# Patient Record
Sex: Female | Born: 1935 | Race: White | Hispanic: No | Marital: Single | Smoking: Never smoker
Health system: Southern US, Community
[De-identification: ages and names within clinical notes are randomized; demographics above are authoritative.]

## PROBLEM LIST (undated history)

## (undated) DIAGNOSIS — R51 Headache: Secondary | ICD-10-CM

## (undated) DIAGNOSIS — I2510 Atherosclerotic heart disease of native coronary artery without angina pectoris: Secondary | ICD-10-CM

## (undated) DIAGNOSIS — N289 Disorder of kidney and ureter, unspecified: Secondary | ICD-10-CM

## (undated) DIAGNOSIS — K5790 Diverticulosis of intestine, part unspecified, without perforation or abscess without bleeding: Secondary | ICD-10-CM

## (undated) DIAGNOSIS — M1990 Unspecified osteoarthritis, unspecified site: Secondary | ICD-10-CM

## (undated) DIAGNOSIS — R519 Headache, unspecified: Secondary | ICD-10-CM

## (undated) DIAGNOSIS — I509 Heart failure, unspecified: Secondary | ICD-10-CM

## (undated) HISTORY — PX: ROTATOR CUFF REPAIR: SHX139

## (undated) HISTORY — PX: ABDOMINAL HYSTERECTOMY: SHX81

## (undated) HISTORY — PX: APPENDECTOMY: SHX54

## (undated) HISTORY — PX: JOINT REPLACEMENT: SHX530

## (undated) HISTORY — PX: KNEE ARTHROSCOPY: SUR90

---

## 1998-09-15 ENCOUNTER — Encounter: Payer: Self-pay | Admitting: Specialist

## 1998-09-15 ENCOUNTER — Ambulatory Visit (HOSPITAL_COMMUNITY): Admission: RE | Admit: 1998-09-15 | Discharge: 1998-09-15 | Payer: Self-pay | Admitting: Specialist

## 1998-10-20 ENCOUNTER — Ambulatory Visit (HOSPITAL_COMMUNITY): Admission: RE | Admit: 1998-10-20 | Discharge: 1998-10-20 | Payer: Self-pay | Admitting: Specialist

## 1998-10-20 ENCOUNTER — Encounter: Payer: Self-pay | Admitting: Specialist

## 1998-11-03 ENCOUNTER — Ambulatory Visit (HOSPITAL_COMMUNITY): Admission: RE | Admit: 1998-11-03 | Discharge: 1998-11-03 | Payer: Self-pay | Admitting: Specialist

## 1998-11-03 ENCOUNTER — Encounter: Payer: Self-pay | Admitting: Specialist

## 1998-11-17 ENCOUNTER — Encounter: Payer: Self-pay | Admitting: Specialist

## 1998-11-17 ENCOUNTER — Ambulatory Visit (HOSPITAL_COMMUNITY): Admission: RE | Admit: 1998-11-17 | Discharge: 1998-11-17 | Payer: Self-pay | Admitting: Specialist

## 2000-03-27 ENCOUNTER — Encounter: Payer: Self-pay | Admitting: Emergency Medicine

## 2000-03-27 ENCOUNTER — Inpatient Hospital Stay (HOSPITAL_COMMUNITY): Admission: EM | Admit: 2000-03-27 | Discharge: 2000-03-30 | Payer: Self-pay

## 2000-04-03 ENCOUNTER — Inpatient Hospital Stay (HOSPITAL_COMMUNITY): Admission: RE | Admit: 2000-04-03 | Discharge: 2000-04-07 | Payer: Self-pay | Admitting: Specialist

## 2004-05-11 ENCOUNTER — Inpatient Hospital Stay (HOSPITAL_COMMUNITY): Admission: EM | Admit: 2004-05-11 | Discharge: 2004-05-16 | Payer: Self-pay | Admitting: Emergency Medicine

## 2004-05-16 ENCOUNTER — Ambulatory Visit: Payer: Self-pay | Admitting: Physical Medicine & Rehabilitation

## 2004-05-16 ENCOUNTER — Inpatient Hospital Stay
Admission: RE | Admit: 2004-05-16 | Discharge: 2004-05-22 | Payer: Self-pay | Admitting: Physical Medicine & Rehabilitation

## 2004-05-18 ENCOUNTER — Ambulatory Visit: Payer: Self-pay | Admitting: Physical Medicine & Rehabilitation

## 2004-10-10 ENCOUNTER — Observation Stay (HOSPITAL_COMMUNITY): Admission: RE | Admit: 2004-10-10 | Discharge: 2004-10-11 | Payer: Self-pay | Admitting: Orthopedic Surgery

## 2006-01-01 ENCOUNTER — Ambulatory Visit: Admission: RE | Admit: 2006-01-01 | Discharge: 2006-01-01 | Payer: Self-pay | Admitting: Cardiology

## 2006-01-01 ENCOUNTER — Encounter (INDEPENDENT_AMBULATORY_CARE_PROVIDER_SITE_OTHER): Payer: Self-pay | Admitting: Cardiology

## 2006-01-16 ENCOUNTER — Inpatient Hospital Stay (HOSPITAL_COMMUNITY): Admission: RE | Admit: 2006-01-16 | Discharge: 2006-01-22 | Payer: Self-pay | Admitting: Surgery

## 2006-01-16 ENCOUNTER — Encounter (INDEPENDENT_AMBULATORY_CARE_PROVIDER_SITE_OTHER): Payer: Self-pay | Admitting: *Deleted

## 2010-04-14 ENCOUNTER — Inpatient Hospital Stay (HOSPITAL_COMMUNITY): Admission: EM | Admit: 2010-04-14 | Discharge: 2010-04-20 | Payer: Self-pay | Admitting: Internal Medicine

## 2010-04-16 ENCOUNTER — Encounter (INDEPENDENT_AMBULATORY_CARE_PROVIDER_SITE_OTHER): Payer: Self-pay | Admitting: Internal Medicine

## 2010-04-17 ENCOUNTER — Encounter (INDEPENDENT_AMBULATORY_CARE_PROVIDER_SITE_OTHER): Payer: Self-pay | Admitting: Internal Medicine

## 2010-07-08 ENCOUNTER — Encounter: Payer: Self-pay | Admitting: Surgery

## 2010-08-28 LAB — COMPREHENSIVE METABOLIC PANEL
ALT: 30 U/L (ref 0–35)
ALT: 398 U/L — ABNORMAL HIGH (ref 0–35)
AST: 430 U/L — ABNORMAL HIGH (ref 0–37)
AST: 498 U/L — ABNORMAL HIGH (ref 0–37)
Albumin: 2.7 g/dL — ABNORMAL LOW (ref 3.5–5.2)
Albumin: 2.8 g/dL — ABNORMAL LOW (ref 3.5–5.2)
Albumin: 2.9 g/dL — ABNORMAL LOW (ref 3.5–5.2)
Alkaline Phosphatase: 200 U/L — ABNORMAL HIGH (ref 39–117)
Alkaline Phosphatase: 216 U/L — ABNORMAL HIGH (ref 39–117)
BUN: 17 mg/dL (ref 6–23)
CO2: 24 mEq/L (ref 19–32)
CO2: 28 mEq/L (ref 19–32)
Calcium: 8.4 mg/dL (ref 8.4–10.5)
Calcium: 8.7 mg/dL (ref 8.4–10.5)
Chloride: 109 mEq/L (ref 96–112)
Creatinine, Ser: 1.72 mg/dL — ABNORMAL HIGH (ref 0.4–1.2)
GFR calc Af Amer: 34 mL/min — ABNORMAL LOW (ref >60)
GFR calc Af Amer: 40 mL/min — ABNORMAL LOW (ref >60)
GFR calc non Af Amer: 29 mL/min — ABNORMAL LOW (ref >60)
GFR calc non Af Amer: 29 mL/min — ABNORMAL LOW (ref >60)
Glucose, Bld: 121 mg/dL — ABNORMAL HIGH (ref 70–99)
Glucose, Bld: 71 mg/dL (ref 70–99)
Potassium: 3.3 mEq/L — ABNORMAL LOW (ref 3.5–5.1)
Potassium: 4.1 mEq/L (ref 3.5–5.1)
Potassium: 4.4 mEq/L (ref 3.5–5.1)
Sodium: 142 mEq/L (ref 135–145)
Sodium: 142 mEq/L (ref 135–145)
Total Bilirubin: 4.4 mg/dL — ABNORMAL HIGH (ref 0.3–1.2)
Total Protein: 5.1 g/dL — ABNORMAL LOW (ref 6.0–8.3)
Total Protein: 5.2 g/dL — ABNORMAL LOW (ref 6.0–8.3)

## 2010-08-28 LAB — TYPE AND SCREEN

## 2010-08-28 LAB — CBC
HCT: 26.2 % — ABNORMAL LOW (ref 36.0–46.0)
Hemoglobin: 8.9 g/dL — ABNORMAL LOW (ref 12.0–15.0)
MCH: 31.4 pg (ref 26.0–34.0)
MCHC: 33.7 g/dL (ref 30.0–36.0)
Platelets: 115 10*3/uL — ABNORMAL LOW (ref 150–400)
RBC: 2.71 MIL/uL — ABNORMAL LOW (ref 3.87–5.11)
RBC: 2.86 MIL/uL — ABNORMAL LOW (ref 3.87–5.11)
WBC: 4.1 10*3/uL (ref 4.0–10.5)

## 2010-08-28 LAB — LIPASE, BLOOD: Lipase: 23 U/L (ref 11–59)

## 2010-08-28 LAB — GLUCOSE, CAPILLARY: Glucose-Capillary: 122 mg/dL — ABNORMAL HIGH (ref 70–99)

## 2010-08-29 LAB — URINE MICROSCOPIC-ADD ON

## 2010-08-29 LAB — CBC
Hemoglobin: 9.5 g/dL — ABNORMAL LOW (ref 12.0–15.0)
MCH: 30.8 pg (ref 26.0–34.0)
MCHC: 32.5 g/dL (ref 30.0–36.0)
MCV: 94.6 fL (ref 78.0–100.0)
Platelets: 127 10*3/uL — ABNORMAL LOW (ref 150–400)
RBC: 3.06 MIL/uL — ABNORMAL LOW (ref 3.87–5.11)
RDW: 12.7 % (ref 11.5–15.5)
WBC: 4.4 10*3/uL (ref 4.0–10.5)
WBC: 4.9 10*3/uL (ref 4.0–10.5)

## 2010-08-29 LAB — COMPREHENSIVE METABOLIC PANEL
AST: 37 U/L (ref 0–37)
Albumin: 2.9 g/dL — ABNORMAL LOW (ref 3.5–5.2)
Albumin: 2.9 g/dL — ABNORMAL LOW (ref 3.5–5.2)
Alkaline Phosphatase: 74 U/L (ref 39–117)
BUN: 24 mg/dL — ABNORMAL HIGH (ref 6–23)
Calcium: 8.6 mg/dL (ref 8.4–10.5)
Chloride: 115 mEq/L — ABNORMAL HIGH (ref 96–112)
Creatinine, Ser: 1.81 mg/dL — ABNORMAL HIGH (ref 0.4–1.2)
GFR calc Af Amer: 38 mL/min — ABNORMAL LOW (ref >60)
Potassium: 4.3 mEq/L (ref 3.5–5.1)
Total Bilirubin: 1.1 mg/dL (ref 0.3–1.2)
Total Protein: 4.8 g/dL — ABNORMAL LOW (ref 6.0–8.3)
Total Protein: 5 g/dL — ABNORMAL LOW (ref 6.0–8.3)

## 2010-08-29 LAB — PROTIME-INR
INR: 1.26 (ref 0.00–1.49)
Prothrombin Time: 16 seconds — ABNORMAL HIGH (ref 11.6–15.2)

## 2010-08-29 LAB — URINALYSIS, ROUTINE W REFLEX MICROSCOPIC
Bilirubin Urine: NEGATIVE
Hgb urine dipstick: NEGATIVE
Specific Gravity, Urine: 1.012 (ref 1.005–1.030)
Urobilinogen, UA: 1 mg/dL (ref 0.0–1.0)

## 2010-08-29 LAB — PHOSPHORUS: Phosphorus: 3.5 mg/dL (ref 2.3–4.6)

## 2010-08-29 LAB — MAGNESIUM: Magnesium: 2.3 mg/dL (ref 1.5–2.5)

## 2010-08-29 LAB — DIFFERENTIAL
Eosinophils Relative: 3 % (ref 0–5)
Lymphocytes Relative: 25 % (ref 12–46)
Lymphs Abs: 1.1 10*3/uL (ref 0.7–4.0)
Neutrophils Relative %: 65 % (ref 43–77)

## 2010-08-29 LAB — LIPID PANEL
Total CHOL/HDL Ratio: 3.7 RATIO
VLDL: 16 mg/dL (ref 0–40)

## 2010-08-29 LAB — LIPASE, BLOOD
Lipase: 33 U/L (ref 11–59)
Lipase: 510 U/L — ABNORMAL HIGH (ref 11–59)

## 2010-08-29 LAB — CREATININE, URINE, RANDOM: Creatinine, Urine: 63.2 mg/dL

## 2010-08-29 LAB — SODIUM, URINE, RANDOM: Sodium, Ur: 162 mEq/L

## 2010-11-02 NOTE — Discharge Summary (Signed)
[name], [name]                ACCOUNT NO.:  [id number]   MEDICAL RECORD NO.:  [id number]           PATIENT TYPE:   LOCATION:                                 FACILITY:   PHYSICIAN:  Evelene Croon, M.D.     DATE OF BIRTH:  1917-07-16   DATE OF ADMISSION:  DATE OF DISCHARGE:                                 DISCHARGE SUMMARY   PRIMARY DIAGNOSES:  1. Severe single vessel coronary artery disease.  2. Severe mitral regurgitation with pulmonary edema and congestive heart      failure.   IN HOSPITAL DIAGNOSES:  1. Acute blood loss anemia postoperatively.  2. Volume overload postoperatively.   SECONDARY DIAGNOSES:  1. Hypertension.  2. History of goiter.  3. History of spinal stenosis with pain in her back and legs.  4. Status post left total hip replacement 1994.  5. Status post right hip fracture requiring open reduction internal      fixation with pins.  6. Status post left rotator cuff surgery.  7. Status post hysterectomy.  8. Status post appendectomy.  9. Status post right knee arthroscopy.   ALLERGIES:  Allergic to codeine and erythromycin.   IN HOSPITAL OPERATIONS AND PROCEDURES:  1. Coronary artery bypass grafting x2 using a left internal mammary artery      graft to left anterior descending coronary, saphenous vein graft to      diagonal branch of the left anterior descending.  2. Mitral valve repair with quadrangular resection of flow segment      posterior mitral leaflet with implantation of 28 mm Seguin annuloplasty      ring.  3. Endoscopic vein harvesting from left leg.   HISTORY AND HOSPITAL COURSE:  The patient is an 77-year female who has been  in fairly good health until the end of June when she developed a fairly  sudden onset of shortness of breath and dyspnea as well as lower extremity  edema. She was diagnosed with pulmonary edema and has been hospitalized for  this. She had repeated emergency room visits over a three week period. Her  marked dyspnea  responding to diuresis. Echocardiogram was done showing a  normal ejection fraction with mitral valve prolapse. The patient had been  followed for mitral valve prolapse for several years. She subsequently  underwent cardiac catheterization by Dr. Aleen Campi on December 17, 2005 which  showed significant single-vessel coronary artery disease with a 90% stenosis  of the large first diagonal branch and 60% LAD stenosis just distal to the  diagonal branch. Left ventricular ejection fraction was about 55% with  severe mitral regurgitation, marked left atrial enlargement. There is mild  pulmonary hypertension with PA pressure of 54/14, wedge pressure 21, V wave  42. There is no gradient across the aortic valve.  Cardiac index was 2.78.  The patient then underwent repeat echocardiogram at [facility]  which showed severe mitral regurgitation which was felt to be flow segments  of both the anterior and posterior mitral valve leaflets. Aortic valve had  no significant abnormality. There is severe mitral regurgitation. Ejection  fraction was 60%. The patient was referred to Dr. Laneta Simmers for evaluation.  Dr. Laneta Simmers discussed with the patient undergoing coronary artery bypass  grafting as well as repair of mitral valve. He discussed risks and benefits.  The patient acknowledged understanding and agreed to proceed. Surgery was  scheduled for January 16, 2006. Please see dictated H&P for further History  and Physical exam.   The patient was taken to the operating room on January 16, 2006 where she  underwent coronary artery bypass grafting x2 using a left internal mammary  artery graft to left anterior descending coronary with a saphenous vein  graft to diagonal branch with left anterior descending. The patient also had  done mitral valve repair with quadrangular resection of large segment  posterior mitral leaflet with implantation of 28 mm Seguin annuloplasty  ring. Endoscopic vein harvesting was done to  the left leg. The patient  tolerated this procedure well and was transferred over to the intensive care  unit in stable condition. Immediately following surgery the patient was seen  to be hemodynamically stable. She was extubated the evening of surgery.  Following extubation the patient was seen to be alert and oriented x3.  Neurological intact. The patient's postop course was complicated by acute  blood loss anemia. Her hemoglobin/hematocrit dropped to 8.8 and 25.8 on  postop day #2. __________  the patient seemed to be nauseated. It was held  at that point. Hemoglobin/hematocrit were followed. They remained stable  over the next several days. Iron was restarted postop day #4. Last  hemoglobin and hematocrit was seen postop day #4 8.7 and 25.4. Continued to  be monitored during the remainder of her postoperative course to make sure  that blood loss anemia is resolving. The patient also developed volume  overload postoperatively. She was started on diuretics at that time. She was  still several pounds above her preoperative weight limit prior to discharge  home. She was at 69.8 kg postop day 4. Her preoperative weight limit was 63  kg. Plan will be to continue the patient on diuretics.   Remainder of the patient's postoperative course was pretty much  unremarkable. Chest tubes and lines are DC'd in a normal fashion.  Postoperative chest x-ray was stable. The patient's vital signs are  monitored and seemed to be stable. She was started back on her beta blocker.  Systolic blood pressure remained stable. We will hold ACE inhibitor until  further day and will restart if blood pressure allows. The patient was able  to be weaned off oxygen sating greater than 90% on room air. The patient was  out of bed ambulating with assistance. She did use rolling walker. The  patient's incisions were clean, dry, and healing well. She remained in normal sinus rhythm postoperatively. Due to the patient's  mitral valve  repair it was felt to hold off on starting Coumadin due to the patient's  risks of bleeding due to her being 77 years old. Instead of Coumadin plan  was to start the patient on Plavix as well as aspirin. Continue Plavix for 3  months. The patient was noted to have a low grade temperature 100  postoperatively. This was monitored over next several days and came down so  patient was afebrile. The patient was tolerating regular diet well. No  nausea, vomiting noted.   The patient is tentatively ready for discharge home in next 1-2 days. Follow-  up appointment:  Follow up with Dr. Laneta Simmers February 03, 2006 at noon.  The  patient is to obtain a PA and lateral chest x-ray one hour prior to this  appointment. The patient will need to contact Dr. Pollie Friar office to schedule  a followup appointment with him in two weeks.   INCISIONAL CARE:  The patient is to wash incisions using soap and water. She  is to contact the office if she develops any drainage or opening from her  incision sites. Also contact the office if she develops fever greater than  101 degrees Fahrenheit.   ACTIVITY:  The patient was educated on no driving, no heavy lifting until  released to do so. She is told to ambulate 2-4 times per day, progress as  tolerated, and continue breathing exercises.   DIET:  Low fat, low salt.   MEDICATIONS:  1. Aspirin 325 mg daily.  2. Toprol XL 25 mg daily.  3. Fergon 325 mg b.i.d..  4. Folic acid 1 mg daily.  5. Lasix 40 mg daily x5 days.  6. Potassium chloride 20 mEq daily x5 days.  7. Plavix 75 mg daily.  8. __________  as needed.  9. Tramadol HCL 550 mg q.h.s. p.r.n..  10.Tylenol 650 mg 1-2 tablets q.4-6 h. p.r.n. pain.      Theda Belfast, [place]      Evelene Croon, M.D.  Electronically Signed    KMD/MEDQ  D:  01/20/2006  T:  01/20/2006  Job:  [id number]   cc:   Jaclyn Prime. Lucas Mallow, M.D.
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
## 2010-11-02 NOTE — Op Note (Signed)
[name], [name]                ACCOUNT NO.:  [id number]   MEDICAL RECORD NO.:  [id number]          PATIENT TYPE:  AMB   LOCATION:  DAY                          FACILITY:  [facility]   PHYSICIAN:  Marlowe Kays, M.D.  DATE OF BIRTH:  December 04, 1917   DATE OF PROCEDURE:  10/10/2004  DATE OF DISCHARGE:                                 OPERATIVE REPORT   PREOPERATIVE DIAGNOSES:  1.  Osteoarthritis.  2.  Torn medial and lateral menisci.  3.  Possible loose body, right knee.   POSTOPERATIVE DIAGNOSES:  1.  Osteoarthritis.  2.  Torn medial and lateral menisci.   OPERATION:  Right knee arthroscopy with partial medial and lateral  meniscectomies and generalized joint debridement.   SURGEON:  Marlowe Kays, M.D.   ASSISTANT:  Nurse.   ANESTHESIA:  General.   INDICATION FOR PROCEDURE:  She is having chronic pain and swelling in the  right knee.  Plain films have demonstrated a 1.4 x 0.9 cm bony fragment  above the fibular head and lateral to the tibial plateau with some question  of whether or not this was in the joint.  She also had badly macerated  medial and lateral menisci with tricompartmental arthritic changes.  She and  her family understand that the arthroscopy is being done today for the torn  menisci and possible removal of loose fragment and that it is not expected  to measurably improve the osteoarthritic changes.   PROCEDURE:  Prophylactic antibiotics due to retained hardware in hip.  Administered general anesthesia.  Pneumatic tourniquet with leg Esmarched  out nonsterilely.  Thigh stabilizer and Ace wrap and knee support for left  knee.  The right leg was prepped with DuraPrep from thigh stabilizer to the  ankle, draped in a sterile field.  Superior medial saline inflow.  First an  anterolateral portal medial compartment was evaluated.  She had severe full  thickness chondromalacia with fibrillation of the remnants of articular  cartilage in the medial femoral condyle.   This was all debrided down with  the 3.5 shaver.  Badly macerated medial meniscus was smoothed down both  anteriorly and in the posterior curve.  I think a lot of this meniscus had  already been ground down.  The remaining meniscus was stable on probing.  In  looking up the medial gutter and suprapatellar area, no loose bodies were  noted.  The patella had full thickness wear.  There really was not anything  pre-arthroscopically.  I then reversed portals.  Looking laterally, she had  badly torn lateral meniscus throughout its entire extend with a small  fragment at the anterior horn of the lateral meniscus and full thickness  gouged out area over the lateral tibial plateau, mainly posteriorly.  I  cleaned up the joint mainly with a 3.5 shaver but also trimmed back the  meniscus to a stable rim with the small baskets.  I then made a very  diligent search for the bony fragment including looking in the lateral  capsule and near the lateral tibial plateau and concluded, since I did not  find  it, that it was outside the joint and was not going to be a mechanical  problem for her.  The knee joint was then irrigated until clear and all flow  possible removed.  The transverse portals were closed with 4-0 nylon.  Then  20 cc 0.5% Marcaine with adrenalin and 4 mg morphine were then instilled  through the inflow apparatus which was removed and this portal closed with 4-  0 nylon as well.  Medium Adaptic dressings were applied.  Tourniquet was  released.  At the time of this dictation, she was on her way to recovery in  satisfactory condition with no known complications.      JA/MEDQ  D:  10/10/2004  T:  10/10/2004  Job:  [id number]

## 2010-11-02 NOTE — Op Note (Signed)
[name], [name]                ACCOUNT NO.:  [id number]   MEDICAL RECORD NO.:  [id number]          PATIENT TYPE:  INP   LOCATION:  2313                         FACILITY:  [facility]   PHYSICIAN:  Bedelia Person, M.D.        DATE OF BIRTH:  July 28, 1917   DATE OF PROCEDURE:  01/16/2006  DATE OF DISCHARGE:                                 OPERATIVE REPORT   ATTENDING PHYSICIAN:  Bedelia Person, M.D.   PROCEDURE:  Intraoperative transesophageal echocardiogram.   HISTORY:  Miss Slagel is an elderly female with known coronary artery  disease and recent diagnosis of significant mitral regurgitation.  She will  be evaluated intraoperatively for replacement of her mitral valve using  intraoperative transesophageal echocardiography.   DESCRIPTION OF PROCEDURE:  After induction with general anesthesia, the  airway was secured with an oroendotracheal tube.  Gastric contents were  suctioned with an orogastric tube, which was then removed.  The  transesophageal probe was heavily lubricated and placed down the sleeve.  The sleeve was then heavily lubricated and placed into the oropharynx and  slid down into the distal esophagus without difficulty or resistance.  There  was no evidence of oral or pharyngeal damage at the completion of the case,  when the probe was removed.  A pre-bypass examination revealed the left  ventricle to be slightly thickened.  There were no segmental defects.  Contractility appeared good.  The left atrium was slightly enlarged.  The  appendage was clean, although noted to be slightly large in nature.  The  interatrial septum was it.  The mitral valve leaflets appeared just very  slightly thickened.  The anterior leaflet appeared normal in function.  The  posterior leaflet at the P3 position next to the posteromedial commissure  appeared to be grossly prolapsing or possibly flailed.  Color Doppler  revealed a significant regurgitant flow, which layered out over the anterior  leaflet  and somewhat back, wrapping around into the left atrial appendage.  There was positive reversal of flow in the left pulmonary vein, not seen in  the right pulmonary vein.  The aortic valve had 3 leaflets, opened and  closed appropriately.  They were normal in appearance.  No calcification was  noted.  Color Doppler revealed no aortic insufficiency.  The patient was  placed on cardiopulmonary bypass and underwent prosthetic ring placement and  resection of the P3 portion of the posterior leaflet of the mitral valve, as  well as coronary artery bypass grafting x2.  At the completion of the  procedure, there were numerous left atrial air bubbles, which were removed  with the left ventricular vent.  The post-bypass examination revealed the  left ventricle to again be showing normal function, with no segmental  defects detected.  The mitral valve now had a significantly smaller  diameter, as well as reduced size of the posterior leaflet.  There was no  evidence of prolapsing portions of the anterior or posterior leaflets.  The  leaflets appeared to be coapting well in the valvular plane of the newly  placed ring, which was  also noted. The ring appeared to be seated well.  Color Doppler revealed no regurgitant flow.  Both leaflets were opening and  closing appropriately.  The aortic valve was unchanged.  All leaflets  continued to open and close appropriately, and right heart examination was  unchanged from the pre-bypass examination.           ______________________________  Bedelia Person, M.D.    LK/MEDQ  D:  01/16/2006  T:  01/16/2006  Job:  [id number]
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
## 2010-11-02 NOTE — H&P (Signed)
[name], [name]                ACCOUNT NO.:  [id number]   MEDICAL RECORD NO.:  [id number]          PATIENT TYPE:  EMS   LOCATION:  MAJO                         FACILITY:  [facility]   PHYSICIAN:  Marlowe Kays, M.D.  DATE OF BIRTH:  March 28, 1918   DATE OF ADMISSION:  05/11/2004  DATE OF DISCHARGE:                                HISTORY & PHYSICAL   CHIEF COMPLAINT:  Pain in my left hip.   HISTORY OF PRESENT ILLNESS:  This 77 year old white female was at her home  this past Wednesday, when she fell.  She had pain into the left hip.  She  went to [facility] and was seen by the physician there; he xrayed her  right hip and told her that she had no fracture, it will be sore, and she  needed to walk on her walker for a couple of days.  Apparently the  radiologist came in the next day, read the films, told them that she indeed  had a hip fracture on the left; required treatment.  The patient had been  seen by Dr. Shelle Iron from our practice in the past for right shoulder ligament  repair, and chose to be treated by [place].   When seen in the emergency room, the patient was awake, alert and oriented;  accompanied by family.  Examination of the left lower extremity showed some  shortening; however, there is no external rotation, sensory is intact and  motor was grossly intact.  X-rays were reviewed, and indeed an impacted  femoral neck fracture was seen of the left hip.  These films were shared  with the daughter and the patient.  The patient has had a couple of soda  crackers nearly two hours ago; hopefully we will be able to go ahead and do  a closed reduction with internal fixation, as soon as operating room  schedule permits.  This, of course, are depending on her laboratory values.   PRIMARY CARE PHYSICIAN:  Dr. Hedy Jacob is her physician near her home (near  [facility]).   PAST MEDICAL HISTORY:  1.  She has been treated for hypertension  with Enalapril (dosage unknown).      She does  have hypertension.  2.  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 3.  Right shoulder ligament repair by Dr.      Shelle Iron.   ALLERGIES:  NONE.   FAMILY HISTORY:  Noncontributory.   SOCIAL HISTORY:  The patient is retired and has good family support.  Has no  intake of ETOH or tobacco.   REVIEW OF SYSTEMS:  CNS:  No seizure, shoulder paralysis, numbness, or  double vision.  RESPIRATORY:  No productive cough, no hemoptysis, no  shortness of breath.  GASTROINTESTINAL:  No nausea, vomiting, melena or  bloody stool.  GENITOURINARY:  No discharges or hematuria.  MUSCULOSKELETAL:  Primarily as in present illness.   PHYSICAL EXAMINATION:  GENERAL:  Alert and cooperative, friendly 77 year old  white female; seen lying on emergency room stretcher.  Accompanied  by her  daughter.  VITAL SIGNS:  Blood pressure 134/70, pulse 80, respirations 12.  HEENT:  Normocephalic.  The right eye shows EOM intact, pupil is reactive to  light and accommodation; prosthesis left eye.  NECK:  Supple; no lymphadenopathy.  CHEST:  Clear to auscultation.  No rhonchi or rales, with the patient  supine.  HEART:  Regular rate; however occasional extrasystoles to loud holosystolic  murmur (at least 4/6 at the apex).  ABDOMEN:  Soft and nontender.  Liver or spleen not felt.  GENITOURINARY AND RECTAL:  Not done, due to present illness.  EXTREMITIES:  The left lower extremity is shortened; it is not externally  rotated.  Sensory is grossly intact and motor is intact.   ADMISSION DIAGNOSES:  1.  Impacted left femoral neck fracture.  2.  Hypertension.  3.  Left eye prosthesis.   PLAN:  The patient will undergo closed reduction with internal fixation Ace  cannulated screws, as soon as the patient is cleared by labs, anesthesia,  and can be put on operating room schedule.       DLU/MEDQ  D:  05/11/2004  T:  05/11/2004  Job:  [id number]
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
## 2010-11-02 NOTE — Op Note (Signed)
NAMEOLIVENE, COOKSTON                ACCOUNT NO.:  [id number]   MEDICAL RECORD NO.:  [id number]          PATIENT TYPE:  INP   LOCATION:  2313                         FACILITY:  [facility]   PHYSICIAN:  Evelene Croon, M.D.     DATE OF BIRTH:  10/08/1917   DATE OF PROCEDURE:  01/16/2006  DATE OF DISCHARGE:                                 OPERATIVE REPORT   PREOPERATIVE DIAGNOSIS:  1. Severe single-vessel coronary disease.  2. Severe mitral regurgitation with pulmonary edema and congestive heart      failure.   POSTOPERATIVE DIAGNOSIS:  1. Severe single-vessel coronary disease.  2. Severe mitral regurgitation with pulmonary edema and congestive heart      failure.   PROCEDURE:  Median sternotomy, extracorporeal circulation, coronary bypass  graft surgery x2 using a left internal mammary artery graft to the left  anterior descending coronary with a saphenous vein graft to the diagonal  branch of the left anterior descending, mitral valve repair with  quadrangular resection of flail segment of posterior mitral leaflet with  implantation of a 28 mm Seguin annuloplasty ring.  Endoscopic vein  harvesting from the left leg.   ATTENDING SURGEON:  Evelene Croon, M.D.   ASSISTANT:  Sheliah Plane, MD   SECOND ASSISTANT:  Coral Ceo, P.A.-C.   ANESTHESIA:  General endotracheal.   CLINICAL HISTORY:  This patient is an 77 year old woman who has been in  fairly good health until the end of June when she developed the fairly  sudden onset of marked shortness of breath and dyspnea as well as lower  extremity edema.  She was diagnosed with pulmonary edema and has been  hospitalized for this.  She had repeated emergency room visits over a three  week period for marked dyspnea responding to diuresis.  An echocardiogram  showed normal ejection fraction with mitral valve prolapse.  She has been  followed for mitral valve prolapse for several years.  She subsequently  underwent cardiac catheterization  by Dr. Aleen Campi on December 17, 2005, which  showed significant single-vessel coronary disease with a 90% stenosis of a  large first diagonal branch and a 60% LAD stenosis just distal to the  diagonal branch.  The left ventricular ejection fraction was about 55% with  severe mitral regurgitation and marked left atrial enlargement.  There was  mild pulmonary hypertension with PA pressure of 54/14, a wedge pressure of  21, and a V-wave of 42.  There was no gradient across the aortic valve.  Cardiac index of 2.78.  She subsequently underwent an echocardiogram at  [facility] which showed severe mitral regurgitation with what was  felt to be flail segments of both the anterior and posterior mitral valve  leaflets.  The aortic valve had no significant abnormality.  There was  severe mitral regurgitation.  Ejection fraction was 60%.  I saw the patient  in the office for a consultation and after review of all her studies and  examination of her, I felt that the best treatment would be to proceed with  coronary bypass graft surgery and mitral valve repair or  replacement.  I  discussed the operative procedure with the patient and her family including  alternatives, benefits, and risks including bleeding, blood transfusion,  infection, stroke, myocardial infarction, graft failure, organ failure, and  death.  I also discussed the alternatives for mitral valve replacement  prosthesis if indicated and recommended using a tissue valve given her  advanced age.  They understood and agreed to proceed.   OPERATIVE PROCEDURE:  The patient was taken to the operating room and placed  on the table in a supine position.  After induction of general endotracheal  anesthesia, a Foley catheter was placed in the bladder using sterile  technique.  Then, the chest, abdomen and both lower extremities were prepped  and draped in the usual sterile manner.  The chest was entered through a  median sternotomy incision and  the pericardium opened in the midline.  Examination of the heart showed good ventricular contractility.  The  ascending aorta had no palpable plaques in it.   Then, the left internal mammary artery was harvested from the chest wall as  a pedicle graft.  This was a medium caliber vessel with excellent blood flow  through it.  At the same time, a segment of greater saphenous vein was  harvested from the left leg using endoscopic vein harvest technique.  This  vein was medium size and good quality.   Then, the patient was heparinized.  When an adequate activated clotting time  was achieved, the distal ascending aorta was cannulated using a 20-French  aortic cannula for arterial in flow.  Venous outflow was achieved using bi-  caval venous cannulation with a 24-French metal tip right angle cannula  placed through a pursestring suture in the superior vena cava and a 36-  [place] plastic right angle cannula placed through a pursestring suture in  the low right atrium.  An antegrade cardioplegia and vent cannula was  inserted in the aortic root.  A retrograde cardioplegic cannula was inserted  through the right atrium into the coronary sinus.   The patient placed on cardiopulmonary bypass.  Transesophageal  echocardiogram had been done at the start of the procedure by  anesthesiology.  This showed a fairly normal appearing anterior mitral  leaflet.  There was no significant prolapse and no flail segment.  There was  mild thickening.  The P3 segment of the posterior leaflet appeared to be  flail and there was severe mitral regurgitation through this area.  The left  ventricular function appeared well preserved.   Then, the aorta was crossclamped and 500 mL of cold blood antegrade  cardioplegia was administered in the aortic root with quick arrest of the  heart.  Systemic hypothermia to 28 degrees centigrade and topical hypothermia with iced saline was used.  A temperature probe was placed in   the septum and an insulating pad in the pericardium.   Then, the first distal anastomosis was performed to the diagonal branch.  The internal diameter was 1.75 mm.  The conduit used was a segment of  greater saphenous vein.  The anastomosis was performed in an end-to-side  manner continuous 7-0 Prolene suture.  Flow was noted through the graft and  was excellent.   The second distal anastomosis was performed to the LAD.  The internal  diameter was about 2.5 mm.  The conduit used was the left internal mammary  graft and this was brought through an opening in the left pericardium  anterior to the phrenic nerve.  It was  anastomosed to the LAD in an end-to-  side manner using continuous 8-0 Prolene suture.  The pedicle was sutured to  the epicardium with 6-0 Prolene sutures.  Then, another dose of antegrade  cardioplegia was given in the aortic root.   Attention was then turned to the mitral valve repair.  Additional doses of  retrograde cardioplegia were given at about 20 minute intervals to maintain  myocardial temperature around 10 degrees centigrade.   The left atrium was opened through a vertical incision in the intra-atrial  groove.  The mitral valve retractor was placed.  Examination of the mitral  valve showed a fairly normal anterior leaflet of the valve with very mild  thickening.  There were no prolapsed segments and no evidence of flail.  Examination of the posterior leaflet showed that the P3 segment was flail  with a large single ruptured chord.  The other two segments of  the  posterior leaflet appeared unremarkable.  I felt this to be suitable for  repair.  Then, the P3 segment of the posterior leaflet was resected.  The  annulus in this area was plicated with a single pledgeted 2-0 Ethibond  horizontal mattress suture.  The free edge of the P2 segment of the  posterior mitral leaflet was then reapproximated to the free edge of the  commissure region using interrupted 4-0  Ethibond sutures.  The valve was  then tested with iced saline solution and was completely competent and  looked good.  Then, a series of 2-0 Ethibond sutures were placed around the  mitral annulus.  The annulus was sized by measuring the area of the anterior  leaflet as well as the inter-trigonal distance and a 28-mm St. Jude Seguin  annuloplasty ring was chosen.  This had model number SARP-28, sterile number  [id number].  The sutures were then placed through the annuloplasty ring and it  was lowered into place.  The sutures were tied sequentially.  The ring was  seated nicely.  The valve was again packed with iced saline solution and was  completely competent.  The patient was then rewarmed to 37 degrees  centigrade.  The left atrium was closed in two layers using continuous 3-0  Prolene suture.   Then, the single proximal vein graft anastomosis was performed to the aortic root in an end-to-side manner continuous 6-0 Prolene suture.  Then, the left  side of the heart was de-aired.  The head was placed in a Trendelenburg  position and the clamp removed from mammary pedicle.  There was rapid  warming of the left ventricular septum and return of spontaneous ventricular  fibrillation.  The patient was unclamped with a crossclamp time of 105  minutes.  The heart was defibrillated to sinus rhythm.  The proximal and  distal anastomoses appeared hemostatic and the lie of the grafts  satisfactory.  A graft marker was placed around the proximal anastomosis.  Two temporary right ventricular and right atrial pacing wires were placed  and brought out through the skin.   When the patient had rewarmed to 37 degrees centigrade, transesophageal  echocardiogram was again performed showed good mitral valve function.  There  was no mitral regurgitation.  There was no evidence of mitral stenosis.  Left ventricular function appeared well preserved.  The aortic valve was  functioning normally.  The patient was  then weaned from cardiopulmonary  bypass on low dose dopamine.  Total bypass time was 133 minutes.  Cardiac  function appeared excellent with a  cardiac output of 5 liters per minute.  Protamine was given and the venous and aortic cannulae were removed without  difficulty.  Hemostasis was achieved.  The patient was given 10 units of  platelets due to a platelet count of 63,000.  Three chest tubes were placed,  two in the posterior pericardium, one in the left pleural space, and one in  the anterior mediastinum.  The pericardium was loosely reapproximated over  the heart.  The sternum was closed with #6 stainless steel wire.  The fascia  was closed with continuous #1 Vicryl suture.  The subcutaneous tissue was  closed with continuous 2-0 Vicryl and the skin with 3-0 Vicryl subcuticular  closure.  The lower extremity vein harvest site was closed in layers in a  similar manner.  Sponge, needle and instrument counts were correct according  to the scrub nurse.  A dry sterile dressing was applied over the incisions  and around the chest tubes which were hooked to Pleur-Evac suction.  The  patient remained hemodynamically stable and was transferred to the SICU in  guarded but stable condition.      Evelene Croon, M.D.  Electronically Signed     BB/MEDQ  D:  01/16/2006  T:  01/16/2006  Job:  [id number]   cc:   Jaclyn Prime. Lucas Mallow, M.D.

## 2010-11-02 NOTE — Discharge Summary (Signed)
NAMEYOSHIE, [name]                ACCOUNT NO.:  [id number]   MEDICAL RECORD NO.:  [id number]          PATIENT TYPE:  INP   LOCATION:  5003                         FACILITY:  [facility]   PHYSICIAN:  Marlowe Kays, M.D.  DATE OF BIRTH:  1917-07-04   DATE OF ADMISSION:  05/11/2004  DATE OF DISCHARGE:  05/16/2004                                 DISCHARGE SUMMARY   ADMISSION DIAGNOSES:  1.  Impacted right femoral neck fracture.  2.  Hypertension.  3.  Left eye prosthesis.   DISCHARGE DIAGNOSES:  1.  Impacted right femoral neck fracture.  2.  Hypertension.  3.  Left eye prosthesis.  4.  Mild postoperative anemia.   PROCEDURE:  On May 11, 2004, the patient underwent internal fixation  impacted valgus femoral neck fracture of the right hip with three Ace  cannulated screws, Dooley L. Idolina Primer, P.A.-C, assisted.   HISTORY OF PRESENT ILLNESS:  This 77 year old lady was at her home Wednesday  prior to discharge when she fell.  She was seen at [facility], x-rayed  her right hip and told her that she had no fracture.  She was told to walk  with her walker for a couple of days.  The next day, the radiologist read  the films and said she had a hip fracture.  As she was familiar with our  practice being seen by Dr. Shelle Iron in the past, she chose [place] to be treating  physicians.  She was awake, alert and oriented with family members there.  She was a very pleasant lady despite her discomfort.  X-rays were confirmed  and indeed impacted right femoral neck fracture was seen.  It was decided  she would benefit from surgical intervention and admitted for the above  procedure.   HOSPITAL COURSE:  The patient tolerated her surgical procedure quite well.  We allowed touchdown weightbearing to the operating extremity.  It was felt  she would benefit from an intensive inpatient rehabilitation program.  We  received a rehabilitation consult and felt she would benefit from a stay in  the  subacute care unit and arrangements were made for that discharge.   The wound remained dry postoperatively with no evidence of DVT.  She  maintained touchdown weightbearing.  Calf remained soft.  PAS stockings were  used postop.   The patient was very pleased she would have the ability to gain more level  of activity and confidence in the subacute care unit.   LABORATORY DATA AND X-RAY FINDINGS:  Preoperative hemoglobin 11.3,  hematocrit 32.4.  Final hemoglobin 9.9 with hematocrit 29.1.  Blood  chemistries were normal other than slightly elevated nonfasting glucose at  104, BUN 28.  Urinalysis negative for urinary tract infection.   Chest x-ray showed cardiomegaly with no acute chest findings.  Postoperative  hip films on the right showed pinning at right femoral neck fracture.   CONDITION ON DISCHARGE:  Improved and stable.   DISPOSITION:  The patient is transferred to the subacute care unit.   DISCHARGE MEDICATIONS:  Continue medications.   ACTIVITY:  Continue touchdown weightbearing.  WOUND CARE:  Use dry dressing p.r.n.   FOLLOW UP:  We will see her after her discharge from subacute care unit.  Staples may be removed 14 days after the date of surgery.       DLU/MEDQ  D:  06/29/2004  T:  06/29/2004  Job:  [id number]

## 2010-11-02 NOTE — H&P (Signed)
[name], [name]                ACCOUNT NO.:  [id number]   MEDICAL RECORD NO.:  [id number]          PATIENT TYPE:  INP   LOCATION:  1845                         FACILITY:  [facility]   PHYSICIAN:  Marlowe Kays, M.D.  DATE OF BIRTH:  08/01/1917   DATE OF ADMISSION:  05/11/2004  DATE OF DISCHARGE:                                HISTORY & PHYSICAL   ADDENDUM/CORRECTION:  In the dictation of the history and physical on Ms.  Gutt I may have mentioned that she had a left femoral neck fracture,  actually it should have been a right femoral neck fracture.       DLU/MEDQ  D:  05/11/2004  T:  05/11/2004  Job:  [id number]

## 2010-11-02 NOTE — Op Note (Signed)
NAMESHERRE, WOOTON                ACCOUNT NO.:  [id number]   MEDICAL RECORD NO.:  [id number]          PATIENT TYPE:  INP   LOCATION:  1845                         FACILITY:  [facility]   PHYSICIAN:  Marlowe Kays, M.D.  DATE OF BIRTH:  04-15-1918   DATE OF PROCEDURE:  05/11/2004  DATE OF DISCHARGE:                                 OPERATIVE REPORT   PREOPERATIVE DIAGNOSIS:  Impacted valgus femoral neck fracture, right hip.   POSTOPERATIVE DIAGNOSIS:  Impacted valgus femoral neck fracture, right hip.   OPERATION:  Internal fixation of impacted valgus femoral neck fracture of  right hip with three Ace cannulated screws.   SURGEON:  Marlowe Kays, M.D.   ASSISTANTDruscilla Brownie. Cherlynn June.   ANESTHESIA:  General.   INDICATIONS FOR PROCEDURE:  Impacted valgus femoral neck fracture, right  hip.   DESCRIPTION OF PROCEDURE:  Prophylactic antibiotics.  Satisfactory general  anesthesia.  Foley catheter inserted.  Placed on the fracture table.  Using  C-arm, confirmed position of the fracture and visibility for the case.  The  right hip was then prepped with Duraprep and draped in a sterile field.  Shower curtain employed.  Using the C-arm, I was able to estimate the  initial area of incision through the fascia lata and elevated the vastus  lateralis superiorly and went posteriorly to the femoral shaft.  I then  placed a guide pin on top of the femur and found it to be where it would go  basically down into the mid portion of the femoral neck and head.  I then  used a 135 degree guide to place the guide pin through the femoral neck and  head, down the middle in the lateral projection and the upper portion of the  neck on the ANTEROPOSTERIOR.  I then used the same guide to place two  additional pins parallel and inferior to it with the pin positions confirmed  by the C-arm.  I measured the pins at 90, 100 and 110 mm in length.  I then  used cannulated screws over top the guide pins  until they were then a few  centimeters of their final placement.  I then removed the guide pins and  continued hand tightening the screws until they were what appeared to be  well beneath the articular surface of the femoral head with good position.  Final pictures were taken.  The wound was irrigated with sterile saline.  The vastus lateralis was closed with running 1-0 Vicryl, the fascia lata  with interrupted 1-0 Vicryl, the subcutaneous tissue with 1-0 Vicryl and  staples in the skin.  Betadine and a dry sterile dressing were applied.  She  was gently placed in her PACU bed and taken there without complication.  The  estimated blood loss was basically none.  No blood replacement.      JA/MEDQ  D:  05/11/2004  T:  05/11/2004  Job:  [id number]

## 2013-08-16 ENCOUNTER — Inpatient Hospital Stay (HOSPITAL_COMMUNITY)
Admission: EM | Admit: 2013-08-16 | Discharge: 2013-08-24 | DRG: 872 | Disposition: A | Discharge status: Home Health | Payer: Medicare Other | Attending: Internal Medicine | Admitting: Internal Medicine

## 2013-08-16 ENCOUNTER — Encounter (HOSPITAL_COMMUNITY): Payer: Self-pay | Admitting: Emergency Medicine

## 2013-08-16 ENCOUNTER — Inpatient Hospital Stay (HOSPITAL_COMMUNITY): Payer: Medicare Other

## 2013-08-16 DIAGNOSIS — K651 Peritoneal abscess: Secondary | ICD-10-CM

## 2013-08-16 DIAGNOSIS — K5732 Diverticulitis of large intestine without perforation or abscess without bleeding: Secondary | ICD-10-CM | POA: Diagnosis present

## 2013-08-16 DIAGNOSIS — K630 Abscess of intestine: Secondary | ICD-10-CM | POA: Diagnosis present

## 2013-08-16 DIAGNOSIS — Z79899 Other long term (current) drug therapy: Secondary | ICD-10-CM

## 2013-08-16 DIAGNOSIS — N183 Chronic kidney disease, stage 3 unspecified: Secondary | ICD-10-CM | POA: Diagnosis present

## 2013-08-16 DIAGNOSIS — Z23 Encounter for immunization: Secondary | ICD-10-CM

## 2013-08-16 DIAGNOSIS — Z951 Presence of aortocoronary bypass graft: Secondary | ICD-10-CM

## 2013-08-16 DIAGNOSIS — I5032 Chronic diastolic (congestive) heart failure: Secondary | ICD-10-CM | POA: Diagnosis present

## 2013-08-16 DIAGNOSIS — I2510 Atherosclerotic heart disease of native coronary artery without angina pectoris: Secondary | ICD-10-CM | POA: Diagnosis present

## 2013-08-16 DIAGNOSIS — I129 Hypertensive chronic kidney disease with stage 1 through stage 4 chronic kidney disease, or unspecified chronic kidney disease: Secondary | ICD-10-CM | POA: Diagnosis present

## 2013-08-16 DIAGNOSIS — Z66 Do not resuscitate: Secondary | ICD-10-CM | POA: Diagnosis present

## 2013-08-16 DIAGNOSIS — M1610 Unilateral primary osteoarthritis, unspecified hip: Secondary | ICD-10-CM | POA: Diagnosis present

## 2013-08-16 DIAGNOSIS — A419 Sepsis, unspecified organism: Principal | ICD-10-CM | POA: Diagnosis present

## 2013-08-16 DIAGNOSIS — I959 Hypotension, unspecified: Secondary | ICD-10-CM

## 2013-08-16 DIAGNOSIS — K219 Gastro-esophageal reflux disease without esophagitis: Secondary | ICD-10-CM | POA: Diagnosis present

## 2013-08-16 DIAGNOSIS — D72829 Elevated white blood cell count, unspecified: Secondary | ICD-10-CM | POA: Diagnosis present

## 2013-08-16 DIAGNOSIS — Z881 Allergy status to other antibiotic agents status: Secondary | ICD-10-CM

## 2013-08-16 DIAGNOSIS — N179 Acute kidney failure, unspecified: Secondary | ICD-10-CM | POA: Diagnosis present

## 2013-08-16 DIAGNOSIS — Z96649 Presence of unspecified artificial hip joint: Secondary | ICD-10-CM

## 2013-08-16 DIAGNOSIS — G8929 Other chronic pain: Secondary | ICD-10-CM | POA: Diagnosis present

## 2013-08-16 DIAGNOSIS — K5720 Diverticulitis of large intestine with perforation and abscess without bleeding: Secondary | ICD-10-CM

## 2013-08-16 DIAGNOSIS — Z7982 Long term (current) use of aspirin: Secondary | ICD-10-CM

## 2013-08-16 DIAGNOSIS — I509 Heart failure, unspecified: Secondary | ICD-10-CM | POA: Diagnosis present

## 2013-08-16 DIAGNOSIS — M169 Osteoarthritis of hip, unspecified: Secondary | ICD-10-CM | POA: Diagnosis present

## 2013-08-16 DIAGNOSIS — D649 Anemia, unspecified: Secondary | ICD-10-CM | POA: Diagnosis not present

## 2013-08-16 DIAGNOSIS — Z993 Dependence on wheelchair: Secondary | ICD-10-CM

## 2013-08-16 DIAGNOSIS — N189 Chronic kidney disease, unspecified: Secondary | ICD-10-CM

## 2013-08-16 HISTORY — DX: Atherosclerotic heart disease of native coronary artery without angina pectoris: I25.10

## 2013-08-16 HISTORY — DX: Diverticulosis of intestine, part unspecified, without perforation or abscess without bleeding: K57.90

## 2013-08-16 HISTORY — DX: Unspecified osteoarthritis, unspecified site: M19.90

## 2013-08-16 HISTORY — DX: Headache, unspecified: R51.9

## 2013-08-16 HISTORY — DX: Heart failure, unspecified: I50.9

## 2013-08-16 HISTORY — DX: Disorder of kidney and ureter, unspecified: N28.9

## 2013-08-16 HISTORY — DX: Headache: R51

## 2013-08-16 LAB — CBC WITH DIFFERENTIAL/PLATELET
BASOS ABS: 0 10*3/uL (ref 0.0–0.1)
BASOS PCT: 0 % (ref 0–1)
EOS PCT: 0 % (ref 0–5)
Eosinophils Absolute: 0 10*3/uL (ref 0.0–0.7)
HCT: 31.6 % — ABNORMAL LOW (ref 36.0–46.0)
Hemoglobin: 10.4 g/dL — ABNORMAL LOW (ref 12.0–15.0)
LYMPHS ABS: 0.9 10*3/uL (ref 0.7–4.0)
Lymphocytes Relative: 4 % — ABNORMAL LOW (ref 12–46)
MCH: 32 pg (ref 26.0–34.0)
MCHC: 32.9 g/dL (ref 30.0–36.0)
MCV: 97.2 fL (ref 78.0–100.0)
Monocytes Absolute: 1 10*3/uL (ref 0.1–1.0)
Monocytes Relative: 5 % (ref 3–12)
Neutro Abs: 18.3 10*3/uL — ABNORMAL HIGH (ref 1.7–7.7)
Neutrophils Relative %: 91 % — ABNORMAL HIGH (ref 43–77)
PLATELETS: 197 10*3/uL (ref 150–400)
RBC: 3.25 MIL/uL — ABNORMAL LOW (ref 3.87–5.11)
RDW: 14.1 % (ref 11.5–15.5)
WBC: 20.2 10*3/uL — ABNORMAL HIGH (ref 4.0–10.5)

## 2013-08-16 LAB — URINE MICROSCOPIC-ADD ON

## 2013-08-16 LAB — URINALYSIS, ROUTINE W REFLEX MICROSCOPIC
Glucose, UA: NEGATIVE mg/dL
Hgb urine dipstick: NEGATIVE
KETONES UR: NEGATIVE mg/dL
Nitrite: NEGATIVE
PROTEIN: NEGATIVE mg/dL
Specific Gravity, Urine: 1.018 (ref 1.005–1.030)
UROBILINOGEN UA: 2 mg/dL — AB (ref 0.0–1.0)
pH: 5.5 (ref 5.0–8.0)

## 2013-08-16 LAB — LACTIC ACID, PLASMA: Lactic Acid, Venous: 1.8 mmol/L (ref 0.5–2.2)

## 2013-08-16 LAB — COMPREHENSIVE METABOLIC PANEL
ALT: 31 U/L (ref 0–35)
AST: 17 U/L (ref 0–37)
Albumin: 2.6 g/dL — ABNORMAL LOW (ref 3.5–5.2)
Alkaline Phosphatase: 98 U/L (ref 39–117)
BUN: 39 mg/dL — ABNORMAL HIGH (ref 6–23)
CALCIUM: 8.5 mg/dL (ref 8.4–10.5)
CO2: 20 mEq/L (ref 19–32)
Chloride: 108 mEq/L (ref 96–112)
Creatinine, Ser: 1.91 mg/dL — ABNORMAL HIGH (ref 0.50–1.10)
GFR calc non Af Amer: 22 mL/min — ABNORMAL LOW (ref >90)
GFR, EST AFRICAN AMERICAN: 25 mL/min — AB (ref >90)
GLUCOSE: 190 mg/dL — AB (ref 70–99)
Potassium: 4.8 mEq/L (ref 3.7–5.3)
SODIUM: 143 meq/L (ref 137–147)
TOTAL PROTEIN: 5.9 g/dL — AB (ref 6.0–8.3)
Total Bilirubin: 0.7 mg/dL (ref 0.3–1.2)

## 2013-08-16 LAB — PHOSPHORUS: Phosphorus: 3.2 mg/dL (ref 2.3–4.6)

## 2013-08-16 LAB — MAGNESIUM: MAGNESIUM: 2.4 mg/dL (ref 1.5–2.5)

## 2013-08-16 MED ORDER — CIPROFLOXACIN IN D5W 400 MG/200ML IV SOLN
400.0000 mg | INTRAVENOUS | Status: DC
  Administered 2013-08-17: 400 mg via INTRAVENOUS
  Filled 2013-08-16 (×2): qty 200

## 2013-08-16 MED ORDER — IOHEXOL 300 MG/ML  SOLN
25.0000 mL | INTRAMUSCULAR | Status: AC
  Administered 2013-08-16: 25 mL via ORAL

## 2013-08-16 MED ORDER — METRONIDAZOLE IN NACL 5-0.79 MG/ML-% IV SOLN
500.0000 mg | Freq: Three times a day (TID) | INTRAVENOUS | Status: DC
  Administered 2013-08-16 – 2013-08-18 (×5): 500 mg via INTRAVENOUS
  Filled 2013-08-16 (×7): qty 100

## 2013-08-16 MED ORDER — ACETAMINOPHEN 500 MG PO TABS
500.0000 mg | ORAL_TABLET | Freq: Four times a day (QID) | ORAL | Status: DC | PRN
  Administered 2013-08-17 (×2): 500 mg via ORAL
  Filled 2013-08-16 (×2): qty 1

## 2013-08-16 MED ORDER — FLUTICASONE FUROATE 27.5 MCG/SPRAY NA SUSP: 2.0000 | Freq: Every day | NASAL | Status: DC | PRN

## 2013-08-16 MED ORDER — ASPIRIN 81 MG PO TABS: 81.0000 mg | ORAL_TABLET | Freq: Every day | ORAL | Status: DC

## 2013-08-16 MED ORDER — FUROSEMIDE 20 MG PO TABS
20.0000 mg | ORAL_TABLET | Freq: Every day | ORAL | Status: DC
  Administered 2013-08-17: 20 mg via ORAL
  Filled 2013-08-16: qty 1

## 2013-08-16 MED ORDER — SODIUM CHLORIDE 0.9 % IV SOLN
3.0000 g | INTRAVENOUS | Status: DC
  Filled 2013-08-16: qty 3

## 2013-08-16 MED ORDER — HEPARIN SODIUM (PORCINE) 5000 UNIT/ML IJ SOLN
5000.0000 [IU] | Freq: Three times a day (TID) | INTRAMUSCULAR | Status: DC
  Administered 2013-08-16 – 2013-08-17 (×2): 5000 [IU] via SUBCUTANEOUS
  Filled 2013-08-16 (×5): qty 1

## 2013-08-16 MED ORDER — SODIUM CHLORIDE 0.9 % IV BOLUS (SEPSIS)
1000.0000 mL | Freq: Once | INTRAVENOUS | Status: AC
  Administered 2013-08-16: 1000 mL via INTRAVENOUS

## 2013-08-16 MED ORDER — FENTANYL CITRATE 0.05 MG/ML IJ SOLN
50.0000 ug | Freq: Once | INTRAMUSCULAR | Status: AC
  Administered 2013-08-16: 50 ug via INTRAVENOUS

## 2013-08-16 MED ORDER — SODIUM CHLORIDE 0.9 % IV SOLN
INTRAVENOUS | Status: AC
  Administered 2013-08-17: 05:00:00 via INTRAVENOUS

## 2013-08-16 MED ORDER — ASPIRIN 81 MG PO CHEW
81.0000 mg | CHEWABLE_TABLET | Freq: Every day | ORAL | Status: DC
  Administered 2013-08-17 – 2013-08-24 (×8): 81 mg via ORAL
  Filled 2013-08-16 (×8): qty 1

## 2013-08-16 MED ORDER — ONDANSETRON HCL 4 MG/2ML IJ SOLN
4.0000 mg | Freq: Four times a day (QID) | INTRAMUSCULAR | Status: DC | PRN
  Administered 2013-08-17: 4 mg via INTRAVENOUS

## 2013-08-16 MED ORDER — METOPROLOL SUCCINATE ER 25 MG PO TB24
25.0000 mg | ORAL_TABLET | Freq: Every day | ORAL | Status: DC
  Administered 2013-08-17 – 2013-08-24 (×7): 25 mg via ORAL
  Filled 2013-08-16 (×8): qty 1

## 2013-08-16 MED ORDER — CIPROFLOXACIN IN D5W 400 MG/200ML IV SOLN
400.0000 mg | Freq: Two times a day (BID) | INTRAVENOUS | Status: DC
Start: 2013-08-16 — End: 2013-08-16

## 2013-08-16 MED ORDER — CIPROFLOXACIN IN D5W 400 MG/200ML IV SOLN
400.0000 mg | INTRAVENOUS | Status: DC
  Administered 2013-08-16: 400 mg via INTRAVENOUS
  Filled 2013-08-16: qty 200

## 2013-08-16 MED ORDER — FENTANYL 25 MCG/HR TD PT72: 25.0000 ug | MEDICATED_PATCH | TRANSDERMAL | Status: DC

## 2013-08-16 MED ORDER — FAMOTIDINE 20 MG PO TABS
20.0000 mg | ORAL_TABLET | Freq: Every day | ORAL | Status: DC
  Administered 2013-08-17 – 2013-08-24 (×8): 20 mg via ORAL
  Filled 2013-08-16 (×8): qty 1

## 2013-08-16 MED ORDER — FLUTICASONE PROPIONATE 50 MCG/ACT NA SUSP
2.0000 | Freq: Every day | NASAL | Status: DC | PRN
  Filled 2013-08-16: qty 16

## 2013-08-16 MED ORDER — ONDANSETRON HCL 4 MG PO TABS
4.0000 mg | ORAL_TABLET | Freq: Four times a day (QID) | ORAL | Status: DC | PRN
Start: 2013-08-16 — End: 2013-08-24

## 2013-08-16 MED ORDER — PNEUMOCOCCAL VAC POLYVALENT 25 MCG/0.5ML IJ INJ
0.5000 mL | INJECTION | INTRAMUSCULAR | Status: AC
  Administered 2013-08-17: 0.5 mL via INTRAMUSCULAR
  Filled 2013-08-16: qty 0.5

## 2013-08-16 MED ORDER — MORPHINE SULFATE 2 MG/ML IJ SOLN
1.0000 mg | INTRAMUSCULAR | Status: DC | PRN
  Administered 2013-08-17 (×2): 1 mg via INTRAVENOUS
  Administered 2013-08-21: 2 mg via INTRAVENOUS
  Filled 2013-08-16 (×4): qty 1

## 2013-08-16 NOTE — ED Notes (Signed)
Pt sent here by PCP with CT results that show a pericolonic abscess. Also some abnormal lab values. Pt only complaint is pressure.

## 2013-08-16 NOTE — Consult Note (Signed)
ANTIBIOTIC CONSULT NOTE-Preliminary  Pharmacy Consult for  Unasyn Indication:  Pericolonic abscess  Allergies  Allergen Reactions  . Codeine     unknown  . Erythromycin     unknown    Patient Measurements: Height: 5\' 8"  (172.7 cm) Weight: 154 lb 15.7 oz (70.3 kg) IBW/kg (Calculated) : 63.9  Vital Signs: 98.1 F (36.7 C) ()   BP: 104/56 mmHg  Pulse Rate: 93   Labs: Pending  Estimated Creatinine Clearance: 21.4 ml/min (by C-G formula based on Cr of 1.66).  Microbiology: No results found for this or any previous visit (from the past 720 hour(s)).  Medical History: Past Medical History  Diagnosis Date  . Headache   . Diverticulosis   . CHF (congestive heart failure)   . Coronary artery disease   . Renal disorder   . Arthritis     Current Medication[s] Include: Scheduled:  Scheduled:    Antibiotic[s]: Anti-infectives   Start     Dose/Rate Route Frequency Ordered Stop   08/16/13 1830  Ampicillin-Sulbactam (UNASYN) 3 g in sodium chloride 0.9 % 100 mL IVPB     3 g 100 mL/hr over 60 Minutes Intravenous STAT 08/16/13 1829 08/17/13 1830      Assessment:  80 y/o Female admitted following CT scan results showing a pericolonic abscess by her PCP.  Unasyn per protocol ordered.  Goal of Therapy:  Unasyn selected for infection abscess and adjusted for renal function.   Plan:  1. Preliminary review of pertinent patient information completed.   2. Will begin with one-time dose of Unasyn 3 gm IV x 1, then Unasyn 1.5 gm IV q 12 hours.   3. Will follow up baseline labs, complete initial evaluation, assess the patient, and finalize treatment regimen.  Taunya Goral, Elisha HeadlandEarle J,  Pharm.D.     08/16/2013,6:31 PM

## 2013-08-16 NOTE — ED Provider Notes (Signed)
CSN: [id number]     Arrival date & time 08/16/13  1625 History   First MD Initiated Contact with Patient 08/16/13 1733     Chief Complaint  Patient presents with  . Abdominal Pain     (Consider location/radiation/quality/duration/timing/severity/associated sxs/prior Treatment) The history is provided by the patient and a relative. No language interpreter was used.    [name] Is a 80 year old female from [place] who presents to the emergency department for a peri-colonic abscess.  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  She stays in a wheelchair.  The patient has been complaining to her family for the past week of left lower quadrant abdominal pain.  Over the past few days it has gotten increasingly worse.  Patient states he was able to make a bowel movement last night.  She states it was normal in size and caliber.  Patient denies any urinary symptoms, nausea, vomiting.  Patient saw her PCP today and had a CT scan done which shows a large air-fluid collection in the left hemipelvis most compatible with pericolonic abscess secondary to diverticulitis.  The patient also had a white blood cell count of 19 hemoglobin 10.7 with a left shift of 82% neutrophils bandemia.  Patient's creatinine was at 1.9 up from 1.5, 4 months ago.    Past Medical History  Diagnosis Date  . Headache   . Diverticulosis   . CHF (congestive heart failure)   . Coronary artery disease   . Renal disorder   . Arthritis    History reviewed. No pertinent past surgical history. History reviewed. No pertinent family history. History  Substance Use Topics  . Smoking status: Never Smoker   . Smokeless tobacco: Not on file  . Alcohol Use: No   OB History   Grav Para Term Preterm Abortions TAB SAB Ect Mult Living                 Review of Systems  Ten systems reviewed and are negative for acute change, except as noted in the HPI.     Allergies  Codeine and Erythromycin  Home Medications   Current Outpatient Rx  Name  Route  Sig  Dispense  Refill  . acetaminophen (TYLENOL) 500 MG tablet   Oral   Take 500 mg by mouth every 6 (six) hours as needed for mild pain.         Marland Kitchen aspirin 81 MG tablet   Oral   Take 81 mg by mouth daily.         . enalapril (VASOTEC) 10 MG tablet   Oral   Take 10 mg by mouth daily.         . famotidine (PEPCID) 20 MG tablet   Oral   Take 20 mg by mouth 2 (two) times daily.         . fentaNYL (DURAGESIC - DOSED MCG/HR) 25 MCG/HR patch   Transdermal   Place 25 mcg onto the skin every 3 (three) days.         . fluticasone (VERAMYST) 27.5 MCG/SPRAY nasal spray   Nasal   Place 2 sprays into the nose daily as needed for rhinitis.         . furosemide (LASIX) 40 MG tablet   Oral   Take 40-80 mg by mouth daily as needed for fluid. Take one tablet if fluid is ok.. And take two tablets by mouth if fluid  gets bad         . metoprolol succinate (TOPROL-XL) 50 MG 24 hr tablet   Oral   Take 25-50 mg by mouth daily. Take with or immediately following a meal.         . traMADol (ULTRAM) 50 MG tablet   Oral   Take 50 mg by mouth every 8 (eight) hours as needed for moderate pain.         Marland Kitchen. triamterene-hydrochlorothiazide (DYAZIDE) 37.5-25 MG per capsule   Oral   Take 1 capsule by mouth daily.          BP 92/52  Pulse 100  Temp(Src) 98.1 F (36.7 C)  Resp 18  Wt 155 lb (70.308 kg)  SpO2 98% Physical Exam  Constitutional: She is oriented to person, place, and time. She appears well-developed and well-nourished. No distress.  HENT:  Head: Normocephalic and atraumatic.  Eyes: Conjunctivae are normal. No scleral icterus.  Neck: Normal range of motion.  Cardiovascular: Normal rate, regular rhythm and normal heart sounds.  Exam reveals no gallop and no friction rub.   No murmur heard. Pulmonary/Chest: Effort normal and breath sounds normal. No respiratory  distress.  Abdominal: Soft. Bowel sounds are normal. She exhibits no distension and no mass. There is tenderness. There is guarding.  LLQ tenderness  Neurological: She is alert and oriented to person, place, and time.  Skin: Skin is warm and dry. She is not diaphoretic.    ED Course  Procedures (including critical care time) Labs Review Labs Reviewed  CBC WITH DIFFERENTIAL  COMPREHENSIVE METABOLIC PANEL   Imaging Review No results found.   EKG Interpretation None      MDM   Final diagnoses:  None   Patient will be admitted by Dr. Gwenlyn PerkingMadera. Dr. Derrell Lollingamirez has consulted and feels the patient is not a good surgical candidate. She will need percutaneous drain placed by IR. I started unasyn, however, Dr. Derrell Lollingamirez would like cipro flagyl.  The patient appears reasonably stabilized for admission considering the current resources, flow, and capabilities available in the ED at this time, and I doubt any other [facility] requiring further screening and/or treatment in the ED prior to admission.       Arthor CaptainAbigail Dori Devino, PA-C 08/17/13 [phone]

## 2013-08-16 NOTE — H&P (Signed)
[facility] History and Physical  [name] WUJ:[id number] DOB: [birth year] DOA: 08/16/2013  Referring physician: Dr. Redgie Grayer PCP: Lindwood Qua, MD   Chief Complaint: abd pain, fever and intraabdominal abscess  HPI: [name] is a 80 y.o. female with PMH significant for HTN, diverticulosis, DJD (affecting hip and mainly wheelchair bound), CAD (S/P CABG), diastolic heart failure (last EF 55%); CKD (stage 3, Cr baseline 1.54) and GERD; came to ED from PCP office due to approx 1.5 weeks complaints of abd pain (LLQ, intermittent, crampy and with associated fever). Patient had CT scan at PCP office that demonstrated air-fluid levels in colonic area and suggested pericolonic abscess; also with acute on chronic renal failure (Cr now in the 1.9 range) and elevated WBC's ( 20,000). Patient referred for admission for further evaluation and treatment. She denies N/V, fever, chills, melena, hematochezia, CP, SOB or any other complaints.   Review of Systems:  Negative except as otherwise mentioned on HPI.   Past Medical History  Diagnosis Date  . Headache   . Diverticulosis   . CHF (congestive heart failure)   . Coronary artery disease   . Renal disorder   . Arthritis    Surgical hx: S/p CABG Mitral val repair (procine valve)  Social History:  reports that she has never smoked. She does not have any smokeless tobacco history on file. She reports that she does not drink alcohol. Her drug history is not on file.  Allergies  Allergen Reactions  . Codeine     unknown  . Erythromycin     unknown   FMH: positive just for HTN; otherwise no contributory.  Prior to Admission medications   Medication Sig Start Date End Date Taking? Authorizing Provider  acetaminophen (TYLENOL) 500 MG tablet Take 500 mg by mouth every 6 (six) hours as needed for mild pain.   Yes Historical Provider, MD  aspirin 81 MG tablet Take 81 mg by mouth daily.   Yes Historical Provider, MD  enalapril  (VASOTEC) 10 MG tablet Take 10 mg by mouth daily.   Yes Historical Provider, MD  famotidine (PEPCID) 20 MG tablet Take 20 mg by mouth 2 (two) times daily.   Yes Historical Provider, MD  fentaNYL (DURAGESIC - DOSED MCG/HR) 25 MCG/HR patch Place 25 mcg onto the skin every 3 (three) days.   Yes Historical Provider, MD  fluticasone (VERAMYST) 27.5 MCG/SPRAY nasal spray Place 2 sprays into the nose daily as needed for rhinitis.   Yes Historical Provider, MD  furosemide (LASIX) 40 MG tablet Take 40-80 mg by mouth daily as needed for fluid. Take one tablet if fluid is ok.. And take two tablets by mouth if fluid gets bad   Yes Historical Provider, MD  metoprolol succinate (TOPROL-XL) 50 MG 24 hr tablet Take 25-50 mg by mouth daily. Take with or immediately following a meal.   Yes Historical Provider, MD  traMADol (ULTRAM) 50 MG tablet Take 50 mg by mouth every 8 (eight) hours as needed for moderate pain.   Yes Historical Provider, MD  triamterene-hydrochlorothiazide (DYAZIDE) 37.5-25 MG per capsule Take 1 capsule by mouth daily.   Yes Historical Provider, MD   Physical Exam: Filed Vitals:   08/16/13 2000  BP: 131/72  Pulse: 95  Temp:   Resp: 16    BP 131/72  Pulse 95  Temp(Src) 98.1 F (36.7 C)  Resp 16  Ht 5\' 8"  (1.727 m)  Wt 70.3 kg (154 lb 15.7 oz)  BMI 23.57 kg/m2  SpO2 100%  General:  Appears calm; slight warm to touch; mild discomfort due to pain in her belly  Eyes: PERRL, normal conjunctiva; no nystagmus; left ptosis ENT: hard of hearing, lips & tongue; no erythema or exudates inside her mouth; fair dentition Neck: no LAD, masses or thyromegaly, no JVD Cardiovascular: RRR, soft SEM, no rubs or gallops. No LE edema. Telemetry and EKG: SR, no arrhythmias  Respiratory: CTA bilaterally, no w/r/r. Normal respiratory effort. Abdomen: soft,mild distension, n guarding; positive BS and LLQ pain with positive rebound Skin: no rash or induration seen on exam Musculoskeletal: grossly normal  tone BUE/BLE Psychiatric: grossly normal mood and affect, speech fluent and appropriate Neurologic: non-focal.          Labs on Admission:  Basic Metabolic Panel:  Recent Labs Lab 08/16/13 1829  NA 143  K 4.8  CL 108  CO2 20  GLUCOSE 190*  BUN 39*  CREATININE 1.91*  CALCIUM 8.5   Liver Function Tests:  Recent Labs Lab 08/16/13 1829  AST 17  ALT 31  ALKPHOS 98  BILITOT 0.7  PROT 5.9*  ALBUMIN 2.6*   CBC:  Recent Labs Lab 08/16/13 1829  WBC 20.2*  NEUTROABS 18.3*  HGB 10.4*  HCT 31.6*  MCV 97.2  PLT 197    Radiological Exams on Admission: No results found.  EKG:  No acute ischemic changes appreciated. Sinus rhythm with some PAC's.  Assessment/Plan Sirs/early sepsis due to Pericolonic abscess due to diverticulitis: patient with low grade fever in ED, but reported fever at home; elevated WBC's, soft BP and identified source of infection. -poor candidate given high risk for surgery, due to advance age and chronic cormobidities. -surgery consulted and on board -will admit to med-surg bed -IVF's and NPO status -IV cipro and flagyl -repeat CT abd with oral contrast -PRN antiemetics, analgesics and antipyretics  -IR consult in am per surgery for drain placement (if possible)  -follow rec's  2-Leukocytosis: secondary to #1. -continue antibiotics -follow WBC trend  3-Acute on chronic renal failure: (stage 3 at baseline), last Cr approx 4 months ago 1.5 -will stop enalapril and hold lasix/HCTZ for tonight -will check UA -hypotension contributing to insult -will provide gentle IV resuscitation -follow renal function and volume status closely  4-Congestive heart failure (CHF): diastolic in nature. Currently compensated. Last EF 55% -follow daily weight and strict I's and O's -resume lasix in am (low dose) -continue B-blocker  5-Degenerative joint disease (DJD) of hip: continue chronic pain medication. -patient on wheelchair mainly at  baseline  6-GERD (gastroesophageal reflux disease): continue famotidine  7-CAD (coronary artery disease):no CP or SOB currently. -continue b-blocker and ASA  DVT: heparin.    General Surgery (Dr. Derrell Lollingamirez)  Code Status: DNR Family Communication: Son and daughter at bedside Disposition Plan: LOS > 2 midnights; med-surg; inpatient  Time spent: 50 minutes  [facility] CenterMADERA,Siri Buege [facility] Pager [phone]

## 2013-08-16 NOTE — Consult Note (Signed)
Reason for Consult:abdominal pain/diverticulitis/intra-abdominal abscess Referring Physician: Dr. Salley Slaughter [name] is an 80 y.o. female.  HPI: the patient is a 80 year old female with multiple medical issues to include congestive heart failure/coronary artery disease, renal disorder and history of diverticulosis. Patient presents today with a one-week history of abdominal pain increasing over this period of time. She states that she had no fevers or chills while home or diarrhea. Patient was seen by her PCP day and underwent an outpatient CT scan as well as laboratory studies. The CT scan revealed air-fluid level proximal 8 x 5 cm likely related to her diverticulitis.  The patient was thus brought to the ER for further care.  Patient states she's never had any blood per rectum. Patient states her last colonoscopy was at least 12 years ago at which time there was only noted to be diverticulosis. The patient has a brother who has a history of colon cancer.  Past Medical History  Diagnosis Date  . Headache   . Diverticulosis   . CHF (congestive heart failure)   . Coronary artery disease   . Renal disorder   . Arthritis     History reviewed. No pertinent past surgical history.  History reviewed. No pertinent family history.  Social History:  reports that she has never smoked. She does not have any smokeless tobacco history on file. She reports that she does not drink alcohol. Her drug history is not on file.  Allergies:  Allergies  Allergen Reactions  . Codeine     unknown  . Erythromycin     unknown    Medications: I have reviewed the patient's current medications.  Results for orders placed during the hospital encounter of 08/16/13 (from the past 48 hour(s))  CBC WITH DIFFERENTIAL     Status: Abnormal   Collection Time    08/16/13  6:29 PM      Result Value Ref Range   WBC 20.2 (*) 4.0 - 10.5 K/uL   RBC 3.25 (*) 3.87 - 5.11 MIL/uL   Hemoglobin 10.4 (*) 12.0 - 15.0 g/dL    HCT 16.1 (*) 09.6 - 46.0 %   MCV 97.2  78.0 - 100.0 fL   MCH 32.0  26.0 - 34.0 pg   MCHC 32.9  30.0 - 36.0 g/dL   RDW 04.5  40.9 - 81.1 %   Platelets 197  150 - 400 K/uL   Neutrophils Relative % 91 (*) 43 - 77 %   Neutro Abs 18.3 (*) 1.7 - 7.7 K/uL   Lymphocytes Relative 4 (*) 12 - 46 %   Lymphs Abs 0.9  0.7 - 4.0 K/uL   Monocytes Relative 5  3 - 12 %   Monocytes Absolute 1.0  0.1 - 1.0 K/uL   Eosinophils Relative 0  0 - 5 %   Eosinophils Absolute 0.0  0.0 - 0.7 K/uL   Basophils Relative 0  0 - 1 %   Basophils Absolute 0.0  0.0 - 0.1 K/uL    No results found.  Review of Systems  Constitutional: Negative for fever and chills.  HENT: Negative.   Eyes: Negative.   Respiratory: Negative.   Cardiovascular: Negative.   Gastrointestinal: Positive for abdominal pain. Negative for diarrhea.  Genitourinary: Negative.   Musculoskeletal: Negative.   Skin: Negative.   Neurological: Negative.   Psychiatric/Behavioral: Negative.    Blood pressure 104/56, pulse 93, temperature 98.1 F (36.7 C), resp. rate 22, height 5\' 8"  (1.727 m), weight 154 lb 15.7 oz (  70.3 kg), SpO2 96.00%. Physical Exam  Vitals reviewed. Constitutional: She is oriented to person, place, and time. She appears well-developed and well-nourished. She is cooperative. No distress. Cervical collar and nasal cannula in place.  HENT:  Head: Normocephalic and atraumatic. Head is without raccoon's eyes, without Battle's sign, without abrasion, without contusion and without laceration.  Right Ear: Hearing, tympanic membrane, external ear and ear canal normal. No lacerations. No drainage or tenderness. No foreign bodies. Tympanic membrane is not perforated. No hemotympanum.  Left Ear: Hearing, tympanic membrane, external ear and ear canal normal. No lacerations. No drainage or tenderness. No foreign bodies. Tympanic membrane is not perforated. No hemotympanum.  Nose: Nose normal. No nose lacerations, sinus tenderness, nasal  deformity or nasal septal hematoma. No epistaxis.  Mouth/Throat: Uvula is midline, oropharynx is clear and moist and mucous membranes are normal. No lacerations.  Eyes: Conjunctivae, EOM and lids are normal. Pupils are equal, round, and reactive to light. No scleral icterus.  Neck: Trachea normal. No JVD present. No spinous process tenderness and no muscular tenderness present. Carotid bruit is not present. No thyromegaly present.  Cardiovascular: Normal rate, regular rhythm, normal heart sounds, intact distal pulses and normal pulses.   Respiratory: Effort normal and breath sounds normal. No respiratory distress. She exhibits no tenderness, no bony tenderness, no laceration and no crepitus.  GI: Soft. Normal appearance and bowel sounds are normal. She exhibits no distension and no mass. There is tenderness (bilat LQ). There is no rigidity, no rebound, no guarding and no CVA tenderness.  Musculoskeletal: Normal range of motion. She exhibits no edema and no tenderness.  Lymphadenopathy:    She has no cervical adenopathy.  Neurological: She is alert and oriented to person, place, and time. She has normal strength. No cranial nerve deficit or sensory deficit. GCS eye subscore is 4. GCS verbal subscore is 5. GCS motor subscore is 6.  Skin: Skin is warm, dry and intact. She is not diaphoretic.  Psychiatric: She has a normal mood and affect. Her speech is normal and behavior is normal.    Assessment/Plan: 80 year old female with diverticulitis and intra-abdominal abscess secondary to diverticulitis.  Patient currently does not have any signs or symptoms of peritonitis on clinical exam.  1. I recommend placing the patient on Cipro Flagyl for intra-abdominal abscess 2. We will obtain a CT scan with by mouth contrast to evaluate this abscess.  I discussed this with Dr. Constance Goltzlson.  I believe the patient will ultimately need interventional radiology to percutaneously drain this fluid collection. We will have IR  consulted in the morning to review CT scan and proceed with possible drainage. 3. I recommend n.p.o., IV fluids  Marigene EhlersRamirez Jr., Jylian Pappalardo 08/16/2013, 7:18 PM

## 2013-08-16 NOTE — ED Notes (Signed)
Madera at bedside

## 2013-08-17 ENCOUNTER — Inpatient Hospital Stay (HOSPITAL_COMMUNITY): Payer: Medicare Other

## 2013-08-17 LAB — CBC
HCT: 28 % — ABNORMAL LOW (ref 36.0–46.0)
Hemoglobin: 9 g/dL — ABNORMAL LOW (ref 12.0–15.0)
MCH: 31.1 pg (ref 26.0–34.0)
MCHC: 32.1 g/dL (ref 30.0–36.0)
MCV: 96.9 fL (ref 78.0–100.0)
Platelets: 152 10*3/uL (ref 150–400)
RBC: 2.89 MIL/uL — AB (ref 3.87–5.11)
RDW: 14 % (ref 11.5–15.5)
WBC: 14.5 10*3/uL — AB (ref 4.0–10.5)

## 2013-08-17 LAB — BASIC METABOLIC PANEL WITH GFR
BUN: 37 mg/dL — ABNORMAL HIGH (ref 6–23)
CO2: 21 meq/L (ref 19–32)
Calcium: 8 mg/dL — ABNORMAL LOW (ref 8.4–10.5)
Chloride: 108 meq/L (ref 96–112)
Creatinine, Ser: 1.8 mg/dL — ABNORMAL HIGH (ref 0.50–1.10)
GFR calc Af Amer: 27 mL/min — ABNORMAL LOW (ref >90)
GFR calc non Af Amer: 23 mL/min — ABNORMAL LOW (ref >90)
Glucose, Bld: 94 mg/dL (ref 70–99)
Potassium: 4.5 meq/L (ref 3.7–5.3)
Sodium: 143 meq/L (ref 137–147)

## 2013-08-17 MED ORDER — FENTANYL CITRATE 0.05 MG/ML IJ SOLN
INTRAMUSCULAR | Status: AC
  Filled 2013-08-17: qty 2

## 2013-08-17 MED ORDER — ACETAMINOPHEN 325 MG PO TABS: 650.0000 mg | ORAL_TABLET | ORAL | Status: DC | PRN

## 2013-08-17 MED ORDER — MIDAZOLAM HCL 2 MG/2ML IJ SOLN
INTRAMUSCULAR | Status: AC | PRN
  Administered 2013-08-17: 0.5 mg via INTRAVENOUS
  Administered 2013-08-17: 1 mg via INTRAVENOUS

## 2013-08-17 MED ORDER — SODIUM CHLORIDE 0.9 % IV SOLN
INTRAVENOUS | Status: DC
  Filled 2013-08-17: qty 1000

## 2013-08-17 MED ORDER — HEPARIN SODIUM (PORCINE) 5000 UNIT/ML IJ SOLN
5000.0000 [IU] | Freq: Three times a day (TID) | INTRAMUSCULAR | Status: DC
  Administered 2013-08-17 – 2013-08-24 (×20): 5000 [IU] via SUBCUTANEOUS
  Filled 2013-08-17 (×22): qty 1

## 2013-08-17 MED ORDER — MIDAZOLAM HCL 2 MG/2ML IJ SOLN
INTRAMUSCULAR | Status: AC
  Filled 2013-08-17: qty 2

## 2013-08-17 MED ORDER — SODIUM CHLORIDE 0.9 % IV BOLUS (SEPSIS)
500.0000 mL | Freq: Once | INTRAVENOUS | Status: AC
  Administered 2013-08-17: 500 mL via INTRAVENOUS

## 2013-08-17 MED ORDER — FENTANYL 25 MCG/HR TD PT72
25.0000 ug | MEDICATED_PATCH | TRANSDERMAL | Status: DC
  Administered 2013-08-17 – 2013-08-23 (×3): 25 ug via TRANSDERMAL
  Filled 2013-08-17 (×3): qty 1

## 2013-08-17 MED ORDER — FENTANYL 25 MCG/HR TD PT72: 25.0000 ug | MEDICATED_PATCH | TRANSDERMAL | Status: DC

## 2013-08-17 MED ORDER — FENTANYL CITRATE 0.05 MG/ML IJ SOLN: 12.5000 ug | INTRAMUSCULAR | Status: DC | PRN

## 2013-08-17 MED ORDER — ACETAMINOPHEN 650 MG RE SUPP
325.0000 mg | RECTAL | Status: DC | PRN
  Administered 2013-08-17: 325 mg via RECTAL
  Filled 2013-08-17: qty 1

## 2013-08-17 MED ORDER — SODIUM CHLORIDE 0.9 % IV SOLN
INTRAVENOUS | Status: AC
  Administered 2013-08-18 (×2): via INTRAVENOUS

## 2013-08-17 MED ORDER — FENTANYL CITRATE 0.05 MG/ML IJ SOLN
INTRAMUSCULAR | Status: AC | PRN
Start: 2013-08-17 — End: 2013-08-17
  Administered 2013-08-17: 12.5 ug via INTRAVENOUS
  Administered 2013-08-17: 25 ug via INTRAVENOUS

## 2013-08-17 NOTE — Progress Notes (Signed)
[facility] PROGRESS NOTE  [name] UJW:[id number] DOB: [birth year] DOA: 08/16/2013 PCP: Lindwood QuaHOFFMAN,BYRON, MD  Assessment/Plan: #1SIRS/early sepsis secondary to pericolonic abscess and diverticulitis Patient states light improved no abdominal pain. Leukocytosis trending down. Blood pressure with some improvement. Patient still with fevers. Continue IV fluid. Remain n.p.o. Continue IV ciprofloxacin and Flagyl. General surgery following. Per surgery I am consulted for drain placement. Follow.  #2 leukocytosis Secondary to problem #1. Trending down.  #3 acute on chronic kidney disease stage III Baseline creatinine approximately 1.5. Enalapril, Lasix, HCTZ on hold. Renal function is slowly trending back down. Continue gentle hydration.  #4 diastolic congestive heart failure/coronary artery disease Stable. Compensated. Continue to hold diuretics. Continue beta blocker and aspirin.  #5 DJD of the hip Will resume fentanyl patch. Pain management.  #6 gastroesophageal reflux disease Continue Pepcid.  #7 prophylaxis Heparin for DVT prophylaxis.  Code Status: Full Family Communication: updated the patient, daughter and granddaughters at bedside. Disposition Plan: home when medically stable.   Consultants:  General surgery: Dr. Derrell Lollingamirez 08/16/2013  Procedures:  Drain placement pending  CT of the abdomen and pelvis 08/16/2013  Antibiotics:  IV ciprofloxacin 08/16/2013  IV Flagyl 08/16/2013  HPI/Subjective: Patient complaining of back pain. Patient still with abdominal pain however does state some improvement since admission.  Objective: Filed Vitals:   08/17/13 0552  BP: 108/49  Pulse: 105  Temp: 99.3 F (37.4 C)  Resp: 17    Intake/Output Summary (Last 24 hours) at 08/17/13 1009 Last data filed at 08/17/13 0832  Gross per 24 hour  Intake    875 ml  Output      2 ml  Net    873 ml   Filed Weights   08/16/13 1647 08/16/13 1830 08/17/13 0500  Weight: 70.308  kg (155 lb) 70.3 kg (154 lb 15.7 oz) 71.85 kg (158 lb 6.4 oz)    Exam:   General:  NAD  Cardiovascular: RRR  Respiratory: CTAB  Abdomen: soft, tender to palpation the left lower quadrant, positive bowel sounds, nondistended, no rebound, no guarding.  Musculoskeletal: no clubbing cyanosis or edema  Data Reviewed: Basic Metabolic Panel:  Recent Labs Lab 08/16/13 1829 08/16/13 2227 08/17/13 0325  NA 143  --  143  K 4.8  --  4.5  CL 108  --  108  CO2 20  --  21  GLUCOSE 190*  --  94  BUN 39*  --  37*  CREATININE 1.91*  --  1.80*  CALCIUM 8.5  --  8.0*  MG  --  2.4  --   PHOS  --  3.2  --    Liver Function Tests:  Recent Labs Lab 08/16/13 1829  AST 17  ALT 31  ALKPHOS 98  BILITOT 0.7  PROT 5.9*  ALBUMIN 2.6*   No results found for this basename: LIPASE, AMYLASE,  in the last 168 hours No results found for this basename: AMMONIA,  in the last 168 hours CBC:  Recent Labs Lab 08/16/13 1829 08/17/13 0325  WBC 20.2* 14.5*  NEUTROABS 18.3*  --   HGB 10.4* 9.0*  HCT 31.6* 28.0*  MCV 97.2 96.9  PLT 197 152   Cardiac Enzymes: No results found for this basename: CKTOTAL, CKMB, CKMBINDEX, TROPONINI,  in the last 168 hours BNP (last 3 results) No results found for this basename: PROBNP,  in the last 8760 hours CBG: No results found for this basename: GLUCAP,  in the last 168 hours  No results found for this  or any previous visit (from the past 240 hour(s)).   Studies: Ct Abdomen Pelvis Wo Contrast  08/17/2013   CLINICAL DATA:  Abdominal pain. Diverticulitis. Intra-abdominal abscess.  EXAM: CT ABDOMEN AND PELVIS WITHOUT CONTRAST  TECHNIQUE: Multidetector CT imaging of the abdomen and pelvis was performed following the standard protocol without intravenous contrast.  COMPARISON:  08/16/2013 and 11/23/2005.  FINDINGS: Left pelvic 8.4 x 4.7 cm air-fluid collection consistent with an abscess which is immediately adjacent to the sigmoid colon which contain diverticula  and therefore abscess is most likely related to diverticulitis. Adnexa not seen separate from this abscess.  No free intraperitoneal air separate from the abscess.  Small hiatal hernia.  Post left hip replacement causing significant streak artifact.  Prior pinning left hip with pins extending through the femoral head. Marked right hip joint degenerative changes.  Scoliosis and degenerative changes with various degrees of spinal stenosis.  Nonobstructing renal calculi.  1.8 cm right renal lesion cannot be confirmed as a cyst. This is not identified on the 2007 exam. Ultrasound can be obtained for further delineation.  Atherosclerotic type changes of the aorta and iliac arteries with ectasia. Question 9 mm left splenic artery aneurysm.  Post cholecystectomy.  Taking into account limitation by non contrast imaging, no worrisome hepatic, splenic, pancreatic, adrenal or left renal lesion.  IMPRESSION: Left pelvic 8.4 x 4.7 cm air-fluid collection consistent with an abscess which is immediately adjacent to the sigmoid colon which contain diverticula and therefore abscess is most likely related to diverticulitis. Adnexa not seen separate from this abscess.  Prior pinning left hip with pins extending through the femoral head. Marked right hip joint degenerative changes.  Scoliosis and degenerative changes with various degrees of spinal stenosis.  1.8 cm right renal lesion cannot be confirmed as a cyst. This is not identified on the 2007 exam. Ultrasound can be obtained for further delineation.  Atherosclerotic type changes of the aorta and iliac arteries with ectasia. Question 9 mm left splenic artery aneurysm.   Electronically Signed   By: Bridgett Larsson M.D.   On: 08/17/2013 01:08    Scheduled Meds: . aspirin  81 mg Oral Daily  . ciprofloxacin  400 mg Intravenous Q24H  . famotidine  20 mg Oral Daily  . [START ON 08/19/2013] fentaNYL  25 mcg Transdermal Q72H  . furosemide  20 mg Oral Daily  . heparin  5,000 Units  Subcutaneous 3 times per day  . metoprolol succinate  25 mg Oral Daily  . metronidazole  500 mg Intravenous Q8H  . pneumococcal 23 valent vaccine  0.5 mL Intramuscular Tomorrow-1000   Continuous Infusions: . sodium chloride 75 mL/hr at 08/17/13 0441    Principal Problem:   Pericolonic abscess Active Problems:   Leukocytosis   Acute on chronic renal failure   Congestive heart failure (CHF)   Degenerative joint disease (DJD) of hip   GERD (gastroesophageal reflux disease)   CAD (coronary artery disease)   Pericolonic abscess due to diverticulitis    Time spent: 35 minutes    [facility] MD [facility] Pager [phone]. If 7PM-7AM, please contact night-coverage at [email or web address], password [facility] 08/17/2013, 10:09 AM  LOS: 1 day

## 2013-08-17 NOTE — Progress Notes (Signed)
Patient interviewed and examined, agree with PA note above.  Mariella SaaBenjamin T Dessirae Scarola MD, FACS  08/17/2013 9:40 AM

## 2013-08-17 NOTE — Progress Notes (Signed)
Subjective: Complains of leg and back pain currently.  She has chronic pain controlled with fentanyl patch.  Her abdomen is still sore, she told family she thought it was gas for about a week before admission.  Objective: Vital signs in last 24 hours: Temp:  [97.5 F (36.4 C)-99.3 F (37.4 C)] 99.3 F (37.4 C) (03/03 0552) Pulse Rate:  [87-105] 105 (03/03 0552) Resp:  [15-24] 17 (03/03 0552) BP: (91-131)/(44-73) 108/49 mmHg (03/03 0552) SpO2:  [96 %-100 %] 100 % (03/03 0552) Weight:  [70.3 kg (154 lb 15.7 oz)-71.85 kg (158 lb 6.4 oz)] 71.85 kg (158 lb 6.4 oz) (03/03 0500) Last BM Date: 08/17/13  Intake/Output from previous day: 03/02 0701 - 03/03 0700 In: 875 [I.V.:675; IV Piggyback:200] Out: 2 [Urine:1; Stool:1] Intake/Output this shift:    General appearance: alert, cooperative, no distress and back and leg pain are an issue for her now. GI: soft complains of pain mostly right lower abdomen, BS hypoactiive, she is not distended  Lab Results:   Recent Labs  08/16/13 1829 08/17/13 0325  WBC 20.2* 14.5*  HGB 10.4* 9.0*  HCT 31.6* 28.0*  PLT 197 152    BMET  Recent Labs  08/16/13 1829 08/17/13 0325  NA 143 143  K 4.8 4.5  CL 108 108  CO2 20 21  GLUCOSE 190* 94  BUN 39* 37*  CREATININE 1.91* 1.80*  CALCIUM 8.5 8.0*   PT/INR No results found for this basename: LABPROT, INR,  in the last 72 hours   Recent Labs Lab 08/16/13 1829  AST 17  ALT 31  ALKPHOS 98  BILITOT 0.7  PROT 5.9*  ALBUMIN 2.6*     Lipase     Component Value Date/Time   LIPASE 23 04/17/2010 0535     Studies/Results: Ct Abdomen Pelvis Wo Contrast  08/17/2013   CLINICAL DATA:  Abdominal pain. Diverticulitis. Intra-abdominal abscess.  EXAM: CT ABDOMEN AND PELVIS WITHOUT CONTRAST  TECHNIQUE: Multidetector CT imaging of the abdomen and pelvis was performed following the standard protocol without intravenous contrast.  COMPARISON:  08/16/2013 and 11/23/2005.  FINDINGS: Left pelvic 8.4  x 4.7 cm air-fluid collection consistent with an abscess which is immediately adjacent to the sigmoid colon which contain diverticula and therefore abscess is most likely related to diverticulitis. Adnexa not seen separate from this abscess.  No free intraperitoneal air separate from the abscess.  Small hiatal hernia.  Post left hip replacement causing significant streak artifact.  Prior pinning left hip with pins extending through the femoral head. Marked right hip joint degenerative changes.  Scoliosis and degenerative changes with various degrees of spinal stenosis.  Nonobstructing renal calculi.  1.8 cm right renal lesion cannot be confirmed as a cyst. This is not identified on the 2007 exam. Ultrasound can be obtained for further delineation.  Atherosclerotic type changes of the aorta and iliac arteries with ectasia. Question 9 mm left splenic artery aneurysm.  Post cholecystectomy.  Taking into account limitation by non contrast imaging, no worrisome hepatic, splenic, pancreatic, adrenal or left renal lesion.  IMPRESSION: Left pelvic 8.4 x 4.7 cm air-fluid collection consistent with an abscess which is immediately adjacent to the sigmoid colon which contain diverticula and therefore abscess is most likely related to diverticulitis. Adnexa not seen separate from this abscess.  Prior pinning left hip with pins extending through the femoral head. Marked right hip joint degenerative changes.  Scoliosis and degenerative changes with various degrees of spinal stenosis.  1.8 cm right renal lesion cannot be  confirmed as a cyst. This is not identified on the 2007 exam. Ultrasound can be obtained for further delineation.  Atherosclerotic type changes of the aorta and iliac arteries with ectasia. Question 9 mm left splenic artery aneurysm.   Electronically Signed   By: Bridgett Larsson M.D.   On: 08/17/2013 01:08    Medications: . aspirin  81 mg Oral Daily  . ciprofloxacin  400 mg Intravenous Q24H  . famotidine  20 mg  Oral Daily  . [START ON 08/19/2013] fentaNYL  25 mcg Transdermal Q72H  . furosemide  20 mg Oral Daily  . heparin  5,000 Units Subcutaneous 3 times per day  . metoprolol succinate  25 mg Oral Daily  . metronidazole  500 mg Intravenous Q8H  . pneumococcal 23 valent vaccine  0.5 mL Intramuscular Tomorrow-1000   . sodium chloride 75 mL/hr at 08/17/13 0441   Prior to Admission medications   Medication Sig Start Date End Date Taking? Authorizing Provider  acetaminophen (TYLENOL) 500 MG tablet Take 500 mg by mouth every 6 (six) hours as needed for mild pain.   Yes Historical Provider, MD  aspirin 81 MG tablet Take 81 mg by mouth daily.   Yes Historical Provider, MD  enalapril (VASOTEC) 10 MG tablet Take 10 mg by mouth daily.   Yes Historical Provider, MD  famotidine (PEPCID) 20 MG tablet Take 20 mg by mouth 2 (two) times daily.   Yes Historical Provider, MD  fentaNYL (DURAGESIC - DOSED MCG/HR) 25 MCG/HR patch Place 25 mcg onto the skin every 3 (three) days.   Yes Historical Provider, MD  fluticasone (VERAMYST) 27.5 MCG/SPRAY nasal spray Place 2 sprays into the nose daily as needed for rhinitis.   Yes Historical Provider, MD  furosemide (LASIX) 40 MG tablet Take 40-80 mg by mouth daily as needed for fluid. Take one tablet if fluid is ok.. And take two tablets by mouth if fluid gets bad   Yes Historical Provider, MD  metoprolol succinate (TOPROL-XL) 50 MG 24 hr tablet Take 25-50 mg by mouth daily. Take with or immediately following a meal.   Yes Historical Provider, MD  traMADol (ULTRAM) 50 MG tablet Take 50 mg by mouth every 8 (eight) hours as needed for moderate pain.   Yes Historical Provider, MD  triamterene-hydrochlorothiazide (DYAZIDE) 37.5-25 MG per capsule Take 1 capsule by mouth daily.   Yes Historical Provider, MD     Assessment/Plan Diverticulitis with abscess Hypertension CAD/HX OF CHF/CABG x 2, 2007 Renal Insuffiencey Hx of goiter Spinal stenosis with back and leg  pain Cholecystectomy 2011 (DB)   Plan:  She is going to IR and DR. Lowella Dandy is going to try to drain fluid collection.     LOS: 1 day    Juanna Pudlo 08/17/2013

## 2013-08-17 NOTE — Procedures (Signed)
CT guided placement of 10 French drain in left pelvic abscess.  30 ml of clay colored foul-smelling fluid was removed.  No immediate complication.

## 2013-08-17 NOTE — ED Provider Notes (Signed)
Medical screening examination/treatment/procedure(s) were conducted as a shared visit with non-physician practitioner(s) and myself.  I personally evaluated the patient during the encounter.   EKG Interpretation   Date/Time:  Monday August 16 2013 18:14:18 EST Ventricular Rate:  93 PR Interval:  121 QRS Duration: 75 QT Interval:  339 QTC Calculation: 422 R Axis:   62 Text Interpretation:  Sinus rhythm Atrial premature complexes Borderline  low voltage, extremity leads Minimal ST depression, inferior leads  Confirmed by [facility]  MD, DAVID ([id number]) on 08/16/2013 6:19:28 PM      Admit to Gen Surgery.  Stable in ER.    Darlys Galesavid Masneri, MD 08/17/13 [id number]

## 2013-08-17 NOTE — Progress Notes (Signed)
Patient ID: [name], female   DOB: 25-Apr-1918, 80 y.o.   MRN: [id number] Request received for CT guided aspiration/possible drainage of LLQ diverticular abscess in pt. Patient is a 80 year old female with multiple medical issues to include congestive heart failure/coronary artery disease, renal disorder and history of diverticulosis. [facility] with a one-week history of abdominal pain increasing over this period of time. She states that she had no fevers or chills while home or diarrhea. Patient was seen by her PCP day and underwent an outpatient CT scan as well as laboratory studies. The CT scan revealed air-fluid level proximal 8 x 5 cm likely related to her diverticulitis. The patient was thus brought to the ER for further care. F/u CT done 3/2 confirmed 8.4 x 4.7 cm left pelvic air-fluid collection c/w abscess most likely related to diverticulitis. Imaging studies were reviewed by Dr. Lowella DandyHenn. Additional PMH as below. Exam: pt awake/alert; left eye prosthesis/ptosis; chest- CTA bilat; heart- irreg rhythm (sinus with premature SV complexes); abd- soft,+BS, tender bilat pelvic regions; ext- no edema.   Filed Vitals:   08/16/13 2000 08/16/13 2041 08/17/13 0500 08/17/13 0552  BP: 131/72 101/52  108/49  Pulse: 95 92  105  Temp:  97.5 F (36.4 C)  99.3 F (37.4 C)  TempSrc:  Oral  Oral  Resp: 16 16  17   Height:      Weight:   158 lb 6.4 oz (71.85 kg)   SpO2: 100% 100%  100%   Past Medical History  Diagnosis Date  . Headache   . Diverticulosis   . CHF (congestive heart failure)   . Coronary artery disease   . Renal disorder   . Arthritis   PSH: CABG/MVR , left hip arthroplasty , ORIF rt hip fx, appy                                                                                                                                                         Ct Abdomen Pelvis Wo Contrast  08/17/2013   CLINICAL DATA:  Abdominal pain. Diverticulitis. Intra-abdominal abscess.  EXAM: CT  ABDOMEN AND PELVIS WITHOUT CONTRAST  TECHNIQUE: Multidetector CT imaging of the abdomen and pelvis was performed following the standard protocol without intravenous contrast.  COMPARISON:  08/16/2013 and 11/23/2005.  FINDINGS: Left pelvic 8.4 x 4.7 cm air-fluid collection consistent with an abscess which is immediately adjacent to the sigmoid colon which contain diverticula and therefore abscess is most likely related to diverticulitis. Adnexa not seen separate from this abscess.  No free intraperitoneal air separate from the abscess.  Small hiatal hernia.  Post left hip replacement causing significant streak artifact.  Prior pinning left hip with pins extending through the femoral head. Marked right hip joint degenerative changes.  Scoliosis and degenerative changes with various degrees of  spinal stenosis.  Nonobstructing renal calculi.  1.8 cm right renal lesion cannot be confirmed as a cyst. This is not identified on the 2007 exam. Ultrasound can be obtained for further delineation.  Atherosclerotic type changes of the aorta and iliac arteries with ectasia. Question 9 mm left splenic artery aneurysm.  Post cholecystectomy.  Taking into account limitation by non contrast imaging, no worrisome hepatic, splenic, pancreatic, adrenal or left renal lesion.  IMPRESSION: Left pelvic 8.4 x 4.7 cm air-fluid collection consistent with an abscess which is immediately adjacent to the sigmoid colon which contain diverticula and therefore abscess is most likely related to diverticulitis. Adnexa not seen separate from this abscess.  Prior pinning left hip with pins extending through the femoral head. Marked right hip joint degenerative changes.  Scoliosis and degenerative changes with various degrees of spinal stenosis.  1.8 cm right renal lesion cannot be confirmed as a cyst. This is not identified on the 2007 exam. Ultrasound can be obtained for further delineation.  Atherosclerotic type changes of the aorta and iliac arteries  with ectasia. Question 9 mm left splenic artery aneurysm.   Electronically Signed   By: Bridgett Larsson M.D.   On: 08/17/2013 01:08  Results for orders placed during the hospital encounter of 08/16/13  CBC WITH DIFFERENTIAL      Result Value Ref Range   WBC 20.2 (*) 4.0 - 10.5 K/uL   RBC 3.25 (*) 3.87 - 5.11 MIL/uL   Hemoglobin 10.4 (*) 12.0 - 15.0 g/dL   HCT 40.9 (*) 81.1 - 91.4 %   MCV 97.2  78.0 - 100.0 fL   MCH 32.0  26.0 - 34.0 pg   MCHC 32.9  30.0 - 36.0 g/dL   RDW 78.2  95.6 - 21.3 %   Platelets 197  150 - 400 K/uL   Neutrophils Relative % 91 (*) 43 - 77 %   Neutro Abs 18.3 (*) 1.7 - 7.7 K/uL   Lymphocytes Relative 4 (*) 12 - 46 %   Lymphs Abs 0.9  0.7 - 4.0 K/uL   Monocytes Relative 5  3 - 12 %   Monocytes Absolute 1.0  0.1 - 1.0 K/uL   Eosinophils Relative 0  0 - 5 %   Eosinophils Absolute 0.0  0.0 - 0.7 K/uL   Basophils Relative 0  0 - 1 %   Basophils Absolute 0.0  0.0 - 0.1 K/uL  COMPREHENSIVE METABOLIC PANEL      Result Value Ref Range   Sodium 143  137 - 147 mEq/L   Potassium 4.8  3.7 - 5.3 mEq/L   Chloride 108  96 - 112 mEq/L   CO2 20  19 - 32 mEq/L   Glucose, Bld 190 (*) 70 - 99 mg/dL   BUN 39 (*) 6 - 23 mg/dL   Creatinine, Ser 0.86 (*) 0.50 - 1.10 mg/dL   Calcium 8.5  8.4 - 57.8 mg/dL   Total Protein 5.9 (*) 6.0 - 8.3 g/dL   Albumin 2.6 (*) 3.5 - 5.2 g/dL   AST 17  0 - 37 U/L   ALT 31  0 - 35 U/L   Alkaline Phosphatase 98  39 - 117 U/L   Total Bilirubin 0.7  0.3 - 1.2 mg/dL   GFR calc non Af Amer 22 (*) >90 mL/min   GFR calc Af Amer 25 (*) >90 mL/min  MAGNESIUM      Result Value Ref Range   Magnesium 2.4  1.5 - 2.5 mg/dL  PHOSPHORUS  Result Value Ref Range   Phosphorus 3.2  2.3 - 4.6 mg/dL  URINALYSIS, ROUTINE W REFLEX MICROSCOPIC      Result Value Ref Range   Color, Urine YELLOW  YELLOW   APPearance CLEAR  CLEAR   Specific Gravity, Urine 1.018  1.005 - 1.030   pH 5.5  5.0 - 8.0   Glucose, UA NEGATIVE  NEGATIVE mg/dL   Hgb urine dipstick NEGATIVE   NEGATIVE   Bilirubin Urine SMALL (*) NEGATIVE   Ketones, ur NEGATIVE  NEGATIVE mg/dL   Protein, ur NEGATIVE  NEGATIVE mg/dL   Urobilinogen, UA 2.0 (*) 0.0 - 1.0 mg/dL   Nitrite NEGATIVE  NEGATIVE   Leukocytes, UA TRACE (*) NEGATIVE  BASIC METABOLIC PANEL      Result Value Ref Range   Sodium 143  137 - 147 mEq/L   Potassium 4.5  3.7 - 5.3 mEq/L   Chloride 108  96 - 112 mEq/L   CO2 21  19 - 32 mEq/L   Glucose, Bld 94  70 - 99 mg/dL   BUN 37 (*) 6 - 23 mg/dL   Creatinine, Ser 1.61 (*) 0.50 - 1.10 mg/dL   Calcium 8.0 (*) 8.4 - 10.5 mg/dL   GFR calc non Af Amer 23 (*) >90 mL/min   GFR calc Af Amer 27 (*) >90 mL/min  CBC      Result Value Ref Range   WBC 14.5 (*) 4.0 - 10.5 K/uL   RBC 2.89 (*) 3.87 - 5.11 MIL/uL   Hemoglobin 9.0 (*) 12.0 - 15.0 g/dL   HCT 09.6 (*) 04.5 - 40.9 %   MCV 96.9  78.0 - 100.0 fL   MCH 31.1  26.0 - 34.0 pg   MCHC 32.1  30.0 - 36.0 g/dL   RDW 81.1  91.4 - 78.2 %   Platelets 152  150 - 400 K/uL  URINE MICROSCOPIC-ADD ON      Result Value Ref Range   Squamous Epithelial / LPF RARE  RARE   WBC, UA 3-6  <3 WBC/hpf   RBC / HPF 0-2  <3 RBC/hpf   Bacteria, UA RARE  RARE   Casts HYALINE CASTS (*) NEGATIVE  LACTIC ACID, PLASMA      Result Value Ref Range   Lactic Acid, Venous 1.8  0.5 - 2.2 mmol/L   A/P: Pt with hx abd/pelvic pain, leukocytosis, and findings c/w diverticulitis/assoc LLQ abscess on recent CT. Plan is for CT guided aspiration/possible drainage of LLQ abscess today. Details/risks of procedure d/w pt/family with their understanding and consent.

## 2013-08-18 DIAGNOSIS — K630 Abscess of intestine: Secondary | ICD-10-CM

## 2013-08-18 DIAGNOSIS — I959 Hypotension, unspecified: Secondary | ICD-10-CM

## 2013-08-18 LAB — CBC WITH DIFFERENTIAL/PLATELET
BASOS ABS: 0 10*3/uL (ref 0.0–0.1)
BASOS PCT: 0 % (ref 0–1)
Eosinophils Absolute: 0 10*3/uL (ref 0.0–0.7)
Eosinophils Relative: 0 % (ref 0–5)
HEMATOCRIT: 28.4 % — AB (ref 36.0–46.0)
Hemoglobin: 9.4 g/dL — ABNORMAL LOW (ref 12.0–15.0)
Lymphocytes Relative: 2 % — ABNORMAL LOW (ref 12–46)
Lymphs Abs: 0.4 10*3/uL — ABNORMAL LOW (ref 0.7–4.0)
MCH: 32.2 pg (ref 26.0–34.0)
MCHC: 33.1 g/dL (ref 30.0–36.0)
MCV: 97.3 fL (ref 78.0–100.0)
MONO ABS: 1 10*3/uL (ref 0.1–1.0)
Monocytes Relative: 5 % (ref 3–12)
NEUTROS ABS: 19.4 10*3/uL — AB (ref 1.7–7.7)
NEUTROS PCT: 93 % — AB (ref 43–77)
PLATELETS: 138 10*3/uL — AB (ref 150–400)
RBC: 2.92 MIL/uL — ABNORMAL LOW (ref 3.87–5.11)
RDW: 14.1 % (ref 11.5–15.5)
WBC: 20.8 10*3/uL — ABNORMAL HIGH (ref 4.0–10.5)

## 2013-08-18 LAB — BASIC METABOLIC PANEL
BUN: 34 mg/dL — ABNORMAL HIGH (ref 6–23)
CO2: 18 mEq/L — ABNORMAL LOW (ref 19–32)
CREATININE: 1.8 mg/dL — AB (ref 0.50–1.10)
Calcium: 7.9 mg/dL — ABNORMAL LOW (ref 8.4–10.5)
Chloride: 111 mEq/L (ref 96–112)
GFR, EST AFRICAN AMERICAN: 27 mL/min — AB (ref >90)
GFR, EST NON AFRICAN AMERICAN: 23 mL/min — AB (ref >90)
Glucose, Bld: 121 mg/dL — ABNORMAL HIGH (ref 70–99)
Potassium: 4.3 mEq/L (ref 3.7–5.3)
Sodium: 146 mEq/L (ref 137–147)

## 2013-08-18 MED ORDER — SODIUM CHLORIDE 0.9 % IV BOLUS (SEPSIS)
500.0000 mL | Freq: Once | INTRAVENOUS | Status: AC
  Administered 2013-08-18: 500 mL via INTRAVENOUS

## 2013-08-18 MED ORDER — SODIUM CHLORIDE 0.9 % IV BOLUS (SEPSIS)
250.0000 mL | Freq: Once | INTRAVENOUS | Status: AC
  Administered 2013-08-18: 250 mL via INTRAVENOUS

## 2013-08-18 MED ORDER — PIPERACILLIN-TAZOBACTAM IN DEX 2-0.25 GM/50ML IV SOLN
2.2500 g | Freq: Three times a day (TID) | INTRAVENOUS | Status: DC
Start: 2013-08-18 — End: 2013-08-20
  Administered 2013-08-18 – 2013-08-20 (×6): 2.25 g via INTRAVENOUS
  Filled 2013-08-18 (×9): qty 50

## 2013-08-18 MED ORDER — WHITE PETROLATUM GEL
Status: AC
  Administered 2013-08-18: 0.2
  Filled 2013-08-18: qty 5

## 2013-08-18 NOTE — Progress Notes (Signed)
Subjective: She feels a little less tender today, very sore around the insertion site of the drain.   She is tired and just looks worn out from all of this. Objective: Vital signs in last 24 hours: Temp:  [97.2 F (36.2 C)-100.7 F (38.2 C)] 98.2 F (36.8 C) (03/04 0605) Pulse Rate:  [60-139] 96 (03/04 0605) Resp:  [12-23] 20 (03/04 0605) BP: (80-161)/(39-82) 100/58 mmHg (03/04 0605) SpO2:  [96 %-100 %] 98 % (03/04 0605) Weight:  [72 kg (158 lb 11.7 oz)] 72 kg (158 lb 11.7 oz) (03/04 0457) Last BM Date: 08/17/13 NPO x ice chips and sips Low grade fever yesterday.  Some low BP's early this AM down to 80 at 2100 hrs, 88 at 0300, and up to 100/58 this AM, HR mostly 80-90 range. WBC is back up to 20.8 Creatinine is 1.8 Day 2 of Cipro and Flagyl/swithching to Zosyn. Intake/Output from previous day: 03/03 0701 - 03/04 0700 In: 2615 [I.V.:615; IV Piggyback:500] Out: 731 [Urine:621; Drains:110] Intake/Output this shift:    General appearance: alert, cooperative and no distress Resp: clear to auscultation bilaterally GI: soft, less tender today, not distended.  Drainage is a clear-brownis colored fluid currently in the drain.   Lab Results:   Recent Labs  08/17/13 0325 08/18/13 0355  WBC 14.5* 20.8*  HGB 9.0* 9.4*  HCT 28.0* 28.4*  PLT 152 138*    BMET  Recent Labs  08/17/13 0325 08/18/13 0355  NA 143 146  K 4.5 4.3  CL 108 111  CO2 21 18*  GLUCOSE 94 121*  BUN 37* 34*  CREATININE 1.80* 1.80*  CALCIUM 8.0* 7.9*   PT/INR No results found for this basename: LABPROT, INR,  in the last 72 hours   Recent Labs Lab 08/16/13 1829  AST 17  ALT 31  ALKPHOS 98  BILITOT 0.7  PROT 5.9*  ALBUMIN 2.6*     Lipase     Component Value Date/Time   LIPASE 23 04/17/2010 0535     Studies/Results: Ct Abdomen Pelvis Wo Contrast  08/17/2013   CLINICAL DATA:  Abdominal pain. Diverticulitis. Intra-abdominal abscess.  EXAM: CT ABDOMEN AND PELVIS WITHOUT CONTRAST   TECHNIQUE: Multidetector CT imaging of the abdomen and pelvis was performed following the standard protocol without intravenous contrast.  COMPARISON:  08/16/2013 and 11/23/2005.  FINDINGS: Left pelvic 8.4 x 4.7 cm air-fluid collection consistent with an abscess which is immediately adjacent to the sigmoid colon which contain diverticula and therefore abscess is most likely related to diverticulitis. Adnexa not seen separate from this abscess.  No free intraperitoneal air separate from the abscess.  Small hiatal hernia.  Post left hip replacement causing significant streak artifact.  Prior pinning left hip with pins extending through the femoral head. Marked right hip joint degenerative changes.  Scoliosis and degenerative changes with various degrees of spinal stenosis.  Nonobstructing renal calculi.  1.8 cm right renal lesion cannot be confirmed as a cyst. This is not identified on the 2007 exam. Ultrasound can be obtained for further delineation.  Atherosclerotic type changes of the aorta and iliac arteries with ectasia. Question 9 mm left splenic artery aneurysm.  Post cholecystectomy.  Taking into account limitation by non contrast imaging, no worrisome hepatic, splenic, pancreatic, adrenal or left renal lesion.  IMPRESSION: Left pelvic 8.4 x 4.7 cm air-fluid collection consistent with an abscess which is immediately adjacent to the sigmoid colon which contain diverticula and therefore abscess is most likely related to diverticulitis. Adnexa not seen separate from  this abscess.  Prior pinning left hip with pins extending through the femoral head. Marked right hip joint degenerative changes.  Scoliosis and degenerative changes with various degrees of spinal stenosis.  1.8 cm right renal lesion cannot be confirmed as a cyst. This is not identified on the 2007 exam. Ultrasound can be obtained for further delineation.  Atherosclerotic type changes of the aorta and iliac arteries with ectasia. Question 9 mm left  splenic artery aneurysm.   Electronically Signed   By: Bridgett Larsson M.D.   On: 08/17/2013 01:08   Ct Image Guided Drainage By Percutaneous Catheter  08/17/2013   CLINICAL DATA:  80 year old with a left pelvic abscess.  EXAM: CT-GUIDED DRAINAGE OF LEFT PELVIC ABSCESS COLLECTION  Physician: Rachelle Hora. Lowella Dandy, MD  MEDICATIONS: 1.5 mg versed. Fentanyl 37.5 mcg. A radiology nurse monitored the patient for moderate sedation.  ANESTHESIA/SEDATION: Moderate sedation time: 30 min  PROCEDURE: Informed consent was obtained for a CT-guided drain placement. The patient was placed with the left side elevated. Images through the pelvis were obtained. The left lower abdomen was prepped and draped in a sterile fashion. The skin was anesthetized with 1% lidocaine. An 18 gauge needle was directed from the left iliac wing towards the left pelvic fluid collection. Needle was directed between the left psoas tendon and left gonadal vessels. Clay colored, foul-smelling fluid was aspirated. A stiff Amplatz wire was placed. The tract was dilated and a 10 [place] drain was placed. Approximately 30 mL of fluid was removed. Catheter was sutured to the skin. Fluid sample was sent for Gram stain and culture.  COMPLICATIONS: None  FINDINGS: Air-fluid collection in the left hemipelvis adjacent to the sigmoid colon and the left gonadal vessels. The collection is consistent with an abscess.  IMPRESSION: Successful CT-guided placement of a percutaneous drain within the left pelvic abscess.   Electronically Signed   By: Richarda Overlie M.D.   On: 08/17/2013 18:08    Medications: . aspirin  81 mg Oral Daily  . ciprofloxacin  400 mg Intravenous Q24H  . famotidine  20 mg Oral Daily  . fentaNYL  25 mcg Transdermal Q72H  . heparin  5,000 Units Subcutaneous 3 times per day  . metoprolol succinate  25 mg Oral Daily  . metronidazole  500 mg Intravenous Q8H   . sodium chloride 75 mL/hr at 08/18/13 0515  . sodium chloride 0.9 % 1,000 mL infusion 75 mL/hr at  08/17/13 1854     Assessment/Plan Diverticulitis with abscess/IR drain placed yesterdya with 30 ml of clay colored/malordorus fluid Hypertension  CAD/HX OF CHF/CABG x 2, Mitral valve repair with 28 mm Seguin annuloplasty ring,  2007  Renal Insuffiencey  Hx of goiter  Spinal stenosis with back and leg pain  Cholecystectomy 2011 (DB)   Plan:  Continue antibiotics, if her WBC remains up we may consider changing to Zosyn. I will give her some sips of clears, but would go slow with PO's.  I will let Medicine discuss and handle her BP for now I rechecked her BP myself and BP by machine is 91/44, manuel check 92/50. I plan to give her some extra fluid 250 ml over 2 hours, .  I talked with Dr. Seth Bake and we will also change her antibiotic this AM to Zosyn I have ask pharmacy to do the consult with her age and renal insuffiencey. Recheck labs in Am.  LOS: 2 days    Stellar Gensel 08/18/2013

## 2013-08-18 NOTE — Progress Notes (Signed)
ANTIBIOTIC CONSULT NOTE - INITIAL  Pharmacy Consult:  Zosyn Indication:  Intra-abdominal abscess  Allergies  Allergen Reactions  . Codeine     unknown  . Erythromycin     unknown    Patient Measurements: Height: 5\' 8"  (172.7 cm) Weight: 158 lb 11.7 oz (72 kg) IBW/kg (Calculated) : 63.9  Vital Signs: Temp: 98.2 F (36.8 C) (03/04 0605) Temp src: Oral (03/04 0605) BP: 100/58 mmHg (03/04 0605) Pulse Rate: 96 (03/04 0605) Intake/Output from previous day: 03/03 0701 - 03/04 0700 In: 2615 [I.V.:615; IV Piggyback:500] Out: 731 [Urine:621; Drains:110]  Labs:  Recent Labs  08/16/13 1829 08/17/13 0325 08/18/13 0355  WBC 20.2* 14.5* 20.8*  HGB 10.4* 9.0* 9.4*  PLT 197 152 138*  CREATININE 1.91* 1.80* 1.80*   Estimated Creatinine Clearance: 19.7 ml/min (by C-G formula based on Cr of 1.8). No results found for this basename: VANCOTROUGH, Leodis BinetVANCOPEAK, VANCORANDOM, GENTTROUGH, GENTPEAK, GENTRANDOM, TOBRATROUGH, TOBRAPEAK, TOBRARND, AMIKACINPEAK, AMIKACINTROU, AMIKACIN,  in the last 72 hours   Microbiology: Recent Results (from the past 720 hour(s))  CULTURE, ROUTINE-ABSCESS     Status: None   Collection Time    08/17/13  5:15 PM      Result Value Ref Range Status   Specimen Description ABSCESS   Final   Special Requests PELVIC ABSCESS   Final   Gram Stain PENDING   Incomplete   Culture     Final   Value: NO GROWTH     Performed at [facility]   Report Status PENDING   Incomplete    Medical History: Past Medical History  Diagnosis Date  . Headache   . Diverticulosis   . CHF (congestive heart failure)   . Coronary artery disease   . Renal disorder   . Arthritis       Assessment: 1991 YOF with worsening leukocytosis and temperature post percutaneous tube placement for intra-abdominal abscess drainage.  Patient to start Zosyn as empiric therapy.  She has a history of CKD.  Cipro/Flagyl 3/2 >> 3/4 Zosyn 3/4 >>   Goal of Therapy:  Clearance of infection  / Infection prevention   Plan:  - Zosyn 2.25gm IV Q8H - Monitor renal fxn, clinical course - Watch plts (on heparin SQ)    Suede Greenawalt D. Laney Potashang, PharmD, BCPS Pager:  [phone] 08/18/2013, 9:44 AM

## 2013-08-18 NOTE — Progress Notes (Signed)
Subjective: Pt is s/p LLQ diverticular abscess drain Events noted with hypotension and fever overnight. ?Transient sepsis syndrome post procedure. She reports her abd pain is less overall.  Objective: Physical Exam: BP 100/58  Pulse 96  Temp(Src) 98.2 F (36.8 C) (Oral)  Resp 20  Ht 5\' 8"  (1.727 m)  Wt 158 lb 11.7 oz (72 kg)  BMI 24.14 kg/m2  SpO2 98% LLQ drain intact, site clean, mildly tender This yellow output with some debris   Labs: CBC  Recent Labs  08/17/13 0325 08/18/13 0355  WBC 14.5* 20.8*  HGB 9.0* 9.4*  HCT 28.0* 28.4*  PLT 152 138*   BMET  Recent Labs  08/17/13 0325 08/18/13 0355  NA 143 146  K 4.5 4.3  CL 108 111  CO2 21 18*  GLUCOSE 94 121*  BUN 37* 34*  CREATININE 1.80* 1.80*  CALCIUM 8.0* 7.9*   LFT  Recent Labs  08/16/13 1829  PROT 5.9*  ALBUMIN 2.6*  AST 17  ALT 31  ALKPHOS 98  BILITOT 0.7   PT/INR No results found for this basename: LABPROT, INR,  in the last 72 hours   Studies/Results: Ct Abdomen Pelvis Wo Contrast  08/17/2013   CLINICAL DATA:  Abdominal pain. Diverticulitis. Intra-abdominal abscess.  EXAM: CT ABDOMEN AND PELVIS WITHOUT CONTRAST  TECHNIQUE: Multidetector CT imaging of the abdomen and pelvis was performed following the standard protocol without intravenous contrast.  COMPARISON:  08/16/2013 and 11/23/2005.  FINDINGS: Left pelvic 8.4 x 4.7 cm air-fluid collection consistent with an abscess which is immediately adjacent to the sigmoid colon which contain diverticula and therefore abscess is most likely related to diverticulitis. Adnexa not seen separate from this abscess.  No free intraperitoneal air separate from the abscess.  Small hiatal hernia.  Post left hip replacement causing significant streak artifact.  Prior pinning left hip with pins extending through the femoral head. Marked right hip joint degenerative changes.  Scoliosis and degenerative changes with various degrees of spinal stenosis.  Nonobstructing  renal calculi.  1.8 cm right renal lesion cannot be confirmed as a cyst. This is not identified on the 2007 exam. Ultrasound can be obtained for further delineation.  Atherosclerotic type changes of the aorta and iliac arteries with ectasia. Question 9 mm left splenic artery aneurysm.  Post cholecystectomy.  Taking into account limitation by non contrast imaging, no worrisome hepatic, splenic, pancreatic, adrenal or left renal lesion.  IMPRESSION: Left pelvic 8.4 x 4.7 cm air-fluid collection consistent with an abscess which is immediately adjacent to the sigmoid colon which contain diverticula and therefore abscess is most likely related to diverticulitis. Adnexa not seen separate from this abscess.  Prior pinning left hip with pins extending through the femoral head. Marked right hip joint degenerative changes.  Scoliosis and degenerative changes with various degrees of spinal stenosis.  1.8 cm right renal lesion cannot be confirmed as a cyst. This is not identified on the 2007 exam. Ultrasound can be obtained for further delineation.  Atherosclerotic type changes of the aorta and iliac arteries with ectasia. Question 9 mm left splenic artery aneurysm.   Electronically Signed   By: Bridgett LarssonSteve  Olson M.D.   On: 08/17/2013 01:08   Ct Image Guided Drainage By Percutaneous Catheter  08/17/2013   CLINICAL DATA:  80 year old with a left pelvic abscess.  EXAM: CT-GUIDED DRAINAGE OF LEFT PELVIC ABSCESS COLLECTION  Physician: Rachelle HoraAdam R. Lowella DandyHenn, MD  MEDICATIONS: 1.5 mg versed. Fentanyl 37.5 mcg. A radiology nurse monitored the patient for moderate sedation.  ANESTHESIA/SEDATION: Moderate sedation time: 30 min  PROCEDURE: Informed consent was obtained for a CT-guided drain placement. The patient was placed with the left side elevated. Images through the pelvis were obtained. The left lower abdomen was prepped and draped in a sterile fashion. The skin was anesthetized with 1% lidocaine. An 18 gauge needle was directed from the left  iliac wing towards the left pelvic fluid collection. Needle was directed between the left psoas tendon and left gonadal vessels. Clay colored, foul-smelling fluid was aspirated. A stiff Amplatz wire was placed. The tract was dilated and a 10 [place] drain was placed. Approximately 30 mL of fluid was removed. Catheter was sutured to the skin. Fluid sample was sent for Gram stain and culture.  COMPLICATIONS: None  FINDINGS: Air-fluid collection in the left hemipelvis adjacent to the sigmoid colon and the left gonadal vessels. The collection is consistent with an abscess.  IMPRESSION: Successful CT-guided placement of a percutaneous drain within the left pelvic abscess.   Electronically Signed   By: Richarda Overlie M.D.   On: 08/17/2013 18:08    Assessment/Plan: S/p LLQ diverticular abscess drain BP im[proving this am. WBC up but feeling better Follow closely, drain flushes Other plans per [facility]    LOS: 2 days    Brayton El PA-C 08/18/2013 9:38 AM

## 2013-08-18 NOTE — Progress Notes (Signed)
[facility] PROGRESS NOTE  [name] ZOX:[id number] DOB: Jun 08, 1918 DOA: 08/16/2013 PCP: Lindwood Qua, MD  Assessment/Plan: #1SIRS/early sepsis secondary to pericolonic abscess and diverticulitis -Patient hypotensive overnight and WBC up to 20.8 this a.m. -Bolus with IV fluids, discussed with surgery agree with changing antibiotics to Zosyn -Cultures to date with no growth, follow #2 leukocytosis Secondary to problem #1. As discussed above  #3 acute on chronic kidney disease stage III Baseline creatinine approximately 1.5. Enalapril, Lasix, HCTZ on hold. Renal function is slowly trending back down. Continue gentle hydration.  #4 diastolic congestive heart failure/coronary artery disease Stable. Compensated. Continue to hold diuretics. Continue beta blocker and aspirin. -Monitor fluid status closely with fluids for to #1 as above #5 DJD of the hip Will resume fentanyl patch. Pain management.  #6 gastroesophageal reflux disease Continue Pepcid.  #7 prophylaxis Heparin for DVT prophylaxis.  Code Status: Full Family Communication: updated the patient, daughter and granddaughters at bedside. Disposition Plan: home when medically stable.   Consultants:  General surgery: Dr. Derrell Lolling 08/16/2013  Procedures:  Drain placement pending  CT of the abdomen and pelvis 08/16/2013  Antibiotics:  IV ciprofloxacin/flagyl 08/16/2013>> 3/4  Zosyn started 3/4  HPI/Subjective: Per family had a rough night but feeling better this a.m. she denies nausea vomiting, chest pain and no shortness of breath.  Objective: Filed Vitals:   08/18/13 1911  BP: 101/53  Pulse: 98  Temp: 98.1 F (36.7 C)  Resp: 20    Intake/Output Summary (Last 24 hours) at 08/18/13 1931 Last data filed at 08/18/13 1700  Gross per 24 hour  Intake   2515 ml  Output    530 ml  Net   1985 ml   Filed Weights   08/16/13 1830 08/17/13 0500 08/18/13 0457  Weight: 70.3 kg (154 lb 15.7 oz) 71.85 kg  (158 lb 6.4 oz) 72 kg (158 lb 11.7 oz)    Exam:   General:  NAD  Cardiovascular: RRR  Respiratory: Decreased breath sounds at the bases, no crackles  Abdomen: soft, tender to palpation the left lower quadrant, drain present to the left, positive bowel sounds, nondistended, no rebound, no guarding.  Musculoskeletal: no clubbing cyanosis or edema  Data Reviewed: Basic Metabolic Panel:  Recent Labs Lab 08/16/13 1829 08/16/13 2227 08/17/13 0325 08/18/13 0355  NA 143  --  143 146  K 4.8  --  4.5 4.3  CL 108  --  108 111  CO2 20  --  21 18*  GLUCOSE 190*  --  94 121*  BUN 39*  --  37* 34*  CREATININE 1.91*  --  1.80* 1.80*  CALCIUM 8.5  --  8.0* 7.9*  MG  --  2.4  --   --   PHOS  --  3.2  --   --    Liver Function Tests:  Recent Labs Lab 08/16/13 1829  AST 17  ALT 31  ALKPHOS 98  BILITOT 0.7  PROT 5.9*  ALBUMIN 2.6*   No results found for this basename: LIPASE, AMYLASE,  in the last 168 hours No results found for this basename: AMMONIA,  in the last 168 hours CBC:  Recent Labs Lab 08/16/13 1829 08/17/13 0325 08/18/13 0355  WBC 20.2* 14.5* 20.8*  NEUTROABS 18.3*  --  19.4*  HGB 10.4* 9.0* 9.4*  HCT 31.6* 28.0* 28.4*  MCV 97.2 96.9 97.3  PLT 197 152 138*   Cardiac Enzymes: No results found for this basename: CKTOTAL, CKMB, CKMBINDEX, TROPONINI,  in the last  168 hours BNP (last 3 results) No results found for this basename: PROBNP,  in the last 8760 hours CBG: No results found for this basename: GLUCAP,  in the last 168 hours  Recent Results (from the past 240 hour(s))  CULTURE, ROUTINE-ABSCESS     Status: None   Collection Time    08/17/13  5:15 PM      Result Value Ref Range Status   Specimen Description ABSCESS   Final   Special Requests PELVIC ABSCESS   Final   Gram Stain     Final   Value: ABUNDANT WBC PRESENT,BOTH PMN AND MONONUCLEAR     NO SQUAMOUS EPITHELIAL CELLS SEEN     ABUNDANT GRAM POSITIVE COCCI IN PAIRS     ABUNDANT GRAM NEGATIVE  RODS     FEW GRAM POSITIVE RODS   Culture     Final   Value: NO GROWTH     Performed at [facility]   Report Status PENDING   Incomplete     Studies: Ct Abdomen Pelvis Wo Contrast  08/17/2013   CLINICAL DATA:  Abdominal pain. Diverticulitis. Intra-abdominal abscess.  EXAM: CT ABDOMEN AND PELVIS WITHOUT CONTRAST  TECHNIQUE: Multidetector CT imaging of the abdomen and pelvis was performed following the standard protocol without intravenous contrast.  COMPARISON:  08/16/2013 and 11/23/2005.  FINDINGS: Left pelvic 8.4 x 4.7 cm air-fluid collection consistent with an abscess which is immediately adjacent to the sigmoid colon which contain diverticula and therefore abscess is most likely related to diverticulitis. Adnexa not seen separate from this abscess.  No free intraperitoneal air separate from the abscess.  Small hiatal hernia.  Post left hip replacement causing significant streak artifact.  Prior pinning left hip with pins extending through the femoral head. Marked right hip joint degenerative changes.  Scoliosis and degenerative changes with various degrees of spinal stenosis.  Nonobstructing renal calculi.  1.8 cm right renal lesion cannot be confirmed as a cyst. This is not identified on the 2007 exam. Ultrasound can be obtained for further delineation.  Atherosclerotic type changes of the aorta and iliac arteries with ectasia. Question 9 mm left splenic artery aneurysm.  Post cholecystectomy.  Taking into account limitation by non contrast imaging, no worrisome hepatic, splenic, pancreatic, adrenal or left renal lesion.  IMPRESSION: Left pelvic 8.4 x 4.7 cm air-fluid collection consistent with an abscess which is immediately adjacent to the sigmoid colon which contain diverticula and therefore abscess is most likely related to diverticulitis. Adnexa not seen separate from this abscess.  Prior pinning left hip with pins extending through the femoral head. Marked right hip joint degenerative  changes.  Scoliosis and degenerative changes with various degrees of spinal stenosis.  1.8 cm right renal lesion cannot be confirmed as a cyst. This is not identified on the 2007 exam. Ultrasound can be obtained for further delineation.  Atherosclerotic type changes of the aorta and iliac arteries with ectasia. Question 9 mm left splenic artery aneurysm.   Electronically Signed   By: Bridgett LarssonSteve  Olson M.D.   On: 08/17/2013 01:08   Ct Image Guided Drainage By Percutaneous Catheter  08/17/2013   CLINICAL DATA:  80 year old with a left pelvic abscess.  EXAM: CT-GUIDED DRAINAGE OF LEFT PELVIC ABSCESS COLLECTION  Physician: Rachelle HoraAdam R. Lowella DandyHenn, MD  MEDICATIONS: 1.5 mg versed. Fentanyl 37.5 mcg. A radiology nurse monitored the patient for moderate sedation.  ANESTHESIA/SEDATION: Moderate sedation time: 30 min  PROCEDURE: Informed consent was obtained for a CT-guided drain placement. The patient was placed  with the left side elevated. Images through the pelvis were obtained. The left lower abdomen was prepped and draped in a sterile fashion. The skin was anesthetized with 1% lidocaine. An 18 gauge needle was directed from the left iliac wing towards the left pelvic fluid collection. Needle was directed between the left psoas tendon and left gonadal vessels. Clay colored, foul-smelling fluid was aspirated. A stiff Amplatz wire was placed. The tract was dilated and a 10 [place] drain was placed. Approximately 30 mL of fluid was removed. Catheter was sutured to the skin. Fluid sample was sent for Gram stain and culture.  COMPLICATIONS: None  FINDINGS: Air-fluid collection in the left hemipelvis adjacent to the sigmoid colon and the left gonadal vessels. The collection is consistent with an abscess.  IMPRESSION: Successful CT-guided placement of a percutaneous drain within the left pelvic abscess.   Electronically Signed   By: Richarda Overlie M.D.   On: 08/17/2013 18:08    Scheduled Meds: . aspirin  81 mg Oral Daily  . famotidine  20 mg  Oral Daily  . fentaNYL  25 mcg Transdermal Q72H  . heparin  5,000 Units Subcutaneous 3 times per day  . metoprolol succinate  25 mg Oral Daily  . piperacillin-tazobactam (ZOSYN)  IV  2.25 g Intravenous 3 times per day   Continuous Infusions: . sodium chloride 0.9 % 1,000 mL infusion 75 mL/hr at 08/17/13 1854    Principal Problem:   Pericolonic abscess Active Problems:   Leukocytosis   Acute on chronic renal failure   Congestive heart failure (CHF)   Degenerative joint disease (DJD) of hip   GERD (gastroesophageal reflux disease)   CAD (coronary artery disease)   Pericolonic abscess due to diverticulitis    Time spent: 35 minutes    Kela Millin MD [facility] Pager [phone]. If 7PM-7AM, please contact night-coverage at [email or web address], password [facility] 08/18/2013, 7:31 PM  LOS: 2 days

## 2013-08-18 NOTE — Progress Notes (Signed)
BP dropped again at 0352 to 88/48 (manual), reassessed by another RN and was 80/52.  HR remains irregular and ranges from 70's-100's.  NP K. Schorr notified and gave orders for another 500mL NS bolus.  Patient alert and oriented with no complaints of pain.  [name], rapid response RN called to floor to assess patient.  After bolus BP came up to 96/58.  Awaiting am lab results.  Will continue to monitor.

## 2013-08-18 NOTE — Progress Notes (Addendum)
Pulse continues to be irregular and RN noted documentation of A-Fib while patient was having drain placed in IR last night.  No current Hx of A-Fib. Patient also had only 1 episode of incontinence and urine output through the night.  K. Schorr notified again and gave orders for 12 lead EKG.  EKG showed "ST segment irregularity," no significant change from EKG done at 1200 on 3/3.  No orders received for telemetry.

## 2013-08-18 NOTE — Significant Event (Addendum)
Rapid Response Event Note  Overview: Time Called: 0433 Arrival Time: 0458 Event Type: Hypotension  Initial Focused Assessment: Called by bedside RN regarding hypotension throughout the night. Patient had received 2, 500 mL NS boluses throughout the shift for low BP. HR currently 84, patient has been tachycardic earlier in the shift while febrile. Patient currently afebrile after receiving PR Tylenol earlier in the shift. Upon arrival to unit an additional 500mL bolus started per NP, BP 90/54 manually on left arm. Urine output 125mL and one episode of incontinence throughout the shift. Patient has a history of renal disease. Upon arrival to unit, patient alert and oriented, complains of not feeling well, skin warm and dry, lung sounds diminished in bases, abdominal drain emptied by bedside RN, purulent drainage noted earlier in shift and now serous drainage. Abdomen soft. Labs drawn prior to arrival. Patient on Cipro and Flagyl regiment.   Interventions: Spoke to patient and family regarding plan of care. BP after approximately 300 mL fluid 96/54, patient resting in bed. Waiting on morning labs to result, BC were drawn on 3/3. Will continue to monitor, advised bedside RN to call with further needs.  Event Summary: Name of Physician Notified: Ariel SilkKathryn Schoor, NP at [phone]    [facility]          [facility], [name]

## 2013-08-18 NOTE — Progress Notes (Signed)
Patient had fever of 100.7 and episode of hypotension and tachycardia.  Pharmacy notified and called to send Tylenol suppository because correct dosage not available in Pyxis. BP at 2150 was 80/40 and HR 125. Patient was drowsy and disoriented to place.  NP K. Schorr notified and gave orders for 500mL NS bolus.  After first 500mL bolus BP up to 90/48 and HR down to 84.  Fever resolved after 325mg  Tylenol suppository.  Received orders for second 500mL bolus and afterwards BP was 98/46 with HR 98 and pulse irregular.  Kirtland BouchardK. Schorr notified of results.

## 2013-08-18 NOTE — Progress Notes (Signed)
Patient interviewed and examined, agree with PA note above.  She feels better and is less tender but her white count is elevated. This may just be temporary secondary to placing her catheter. She is stable. Discussed the situation and plan with the patient and her family.  Mariella SaaBenjamin T Nashya Garlington MD, FACS  08/18/2013 8:04 PM

## 2013-08-19 LAB — CBC
HCT: 28.4 % — ABNORMAL LOW (ref 36.0–46.0)
HEMOGLOBIN: 9.1 g/dL — AB (ref 12.0–15.0)
MCH: 31.2 pg (ref 26.0–34.0)
MCHC: 32 g/dL (ref 30.0–36.0)
MCV: 97.3 fL (ref 78.0–100.0)
PLATELETS: 165 10*3/uL (ref 150–400)
RBC: 2.92 MIL/uL — AB (ref 3.87–5.11)
RDW: 14.3 % (ref 11.5–15.5)
WBC: 12.3 10*3/uL — AB (ref 4.0–10.5)

## 2013-08-19 LAB — BASIC METABOLIC PANEL
BUN: 34 mg/dL — ABNORMAL HIGH (ref 6–23)
CO2: 18 meq/L — AB (ref 19–32)
Calcium: 8.2 mg/dL — ABNORMAL LOW (ref 8.4–10.5)
Chloride: 111 mEq/L (ref 96–112)
Creatinine, Ser: 1.73 mg/dL — ABNORMAL HIGH (ref 0.50–1.10)
GFR calc Af Amer: 28 mL/min — ABNORMAL LOW (ref >90)
GFR calc non Af Amer: 24 mL/min — ABNORMAL LOW (ref >90)
Glucose, Bld: 83 mg/dL (ref 70–99)
POTASSIUM: 4.6 meq/L (ref 3.7–5.3)
SODIUM: 146 meq/L (ref 137–147)

## 2013-08-19 MED ORDER — ACYCLOVIR 5 % EX OINT
TOPICAL_OINTMENT | CUTANEOUS | Status: DC
  Administered 2013-08-19 – 2013-08-24 (×23): via TOPICAL
  Filled 2013-08-19: qty 30

## 2013-08-19 MED ORDER — CHLORHEXIDINE GLUCONATE 0.12 % MT SOLN: 15.0000 mL | Freq: Two times a day (BID) | OROMUCOSAL | Status: DC

## 2013-08-19 MED ORDER — BIOTENE DRY MOUTH MT LIQD: 15.0000 mL | Freq: Two times a day (BID) | OROMUCOSAL | Status: DC

## 2013-08-19 NOTE — Progress Notes (Signed)
Patient interviewed and examined, agree with PA note above.  Mariella SaaBenjamin T Kineta Fudala MD, FACS  08/19/2013 5:43 PM

## 2013-08-19 NOTE — Progress Notes (Signed)
Subjective: Patient states she is feeling better today compared to yesterday, family is in room and feels she has much improved. She has been tolerating ginger ale well and is hungry. She denies any N/V, fever or chills.  Objective: Physical Exam: BP 139/70  Pulse 88  Temp(Src) 97.9 F (36.6 C) (Oral)  Resp 20  Ht 5\' 8"  (1.727 m)  Wt 160 lb 15 oz (73 kg)  BMI 24.48 kg/m2  SpO2 94%  General: A&Ox3, NAD, lying in bed. Abd: Soft, ND, NT LLQ drain: dressing intact, output 40cc (110cc), yellow/cloudy.  Labs: CBC  Recent Labs  08/18/13 0355 08/19/13 0605  WBC 20.8* 12.3*  HGB 9.4* 9.1*  HCT 28.4* 28.4*  PLT 138* 165   BMET  Recent Labs  08/18/13 0355 08/19/13 0605  NA 146 146  K 4.3 4.6  CL 111 111  CO2 18* 18*  GLUCOSE 121* 83  BUN 34* 34*  CREATININE 1.80* 1.73*  CALCIUM 7.9* 8.2*   LFT  Recent Labs  08/16/13 1829  PROT 5.9*  ALBUMIN 2.6*  AST 17  ALT 31  ALKPHOS 98  BILITOT 0.7   PT/INR No results found for this basename: LABPROT, INR,  in the last 72 hours   Studies/Results: Ct Image Guided Drainage By Percutaneous Catheter  08/17/2013   CLINICAL DATA:  80 year old with a left pelvic abscess.  EXAM: CT-GUIDED DRAINAGE OF LEFT PELVIC ABSCESS COLLECTION  Physician: Rachelle HoraAdam R. Lowella DandyHenn, MD  MEDICATIONS: 1.5 mg versed. Fentanyl 37.5 mcg. A radiology nurse monitored the patient for moderate sedation.  ANESTHESIA/SEDATION: Moderate sedation time: 30 min  PROCEDURE: Informed consent was obtained for a CT-guided drain placement. The patient was placed with the left side elevated. Images through the pelvis were obtained. The left lower abdomen was prepped and draped in a sterile fashion. The skin was anesthetized with 1% lidocaine. An 18 gauge needle was directed from the left iliac wing towards the left pelvic fluid collection. Needle was directed between the left psoas tendon and left gonadal vessels. Clay colored, foul-smelling fluid was aspirated. A stiff Amplatz wire  was placed. The tract was dilated and a 10 [place] drain was placed. Approximately 30 mL of fluid was removed. Catheter was sutured to the skin. Fluid sample was sent for Gram stain and culture.  COMPLICATIONS: None  FINDINGS: Air-fluid collection in the left hemipelvis adjacent to the sigmoid colon and the left gonadal vessels. The collection is consistent with an abscess.  IMPRESSION: Successful CT-guided placement of a percutaneous drain within the left pelvic abscess.   Electronically Signed   By: Richarda OverlieAdam  Henn M.D.   On: 08/17/2013 18:08    Assessment/Plan: S/p LLQ diverticular abscess drain placed in IR 3/3 WBC trending down 12.3 (20.8), afebrile.  Continue monitoring output and drain flushes. Plans per CCS/TR[facility].   LOS: 3 days    Berneta LevinsMORGAN, Aleza Pew D PA-C 08/19/2013 11:05 AM

## 2013-08-19 NOTE — Progress Notes (Signed)
[facility] PROGRESS NOTE  REVE CROCKET ZOX:[id number] DOB: 1917/11/29 DOA: 08/16/2013 PCP: Lindwood Qua, MD  Assessment/Plan: #1SIRS/early sepsis secondary to pericolonic abscess and diverticulitis -Patient hypotensive overnight and WBC up to 20.8 on 3/5, improved to 12 today 3/6 -BP stable, continue Zosyn -Cultures to date with no growth, follow -Started on clears per surgery, follow #2 leukocytosis Secondary to problem #1. As discussed above improving  #3 acute on chronic kidney disease stage III Baseline creatinine approximately 1.5. Enalapril, Lasix, HCTZ on hold. Renal function is slowly trending back down. Continue gentle hydration.  #4 diastolic congestive heart failure/coronary artery disease Stable. Compensated. Continue to hold diuretics. Continue beta blocker and aspirin. -Monitor fluid status closely with fluids for to #1 as above #5 DJD of the hip Will resume fentanyl patch. Pain management.  #6 gastroesophageal reflux disease Continue Pepcid.  #7 prophylaxis Heparin for DVT prophylaxis.  Code Status: Full Family Communication: updated the patient, daughter and granddaughters at bedside. Disposition Plan: home when medically stable.   Consultants:  General surgery: Dr. Derrell Lolling 08/16/2013  Procedures:  Drain placement pending  CT of the abdomen and pelvis 08/16/2013  Antibiotics:  IV ciprofloxacin/flagyl 08/16/2013>> 3/4  Zosyn started 3/4  HPI/Subjective: Patient states she had a better night, denies chest pain and shortness of breath. Requesting by mouth's  Objective: Filed Vitals:   08/19/13 1406  BP: 128/52  Pulse: 103  Temp: 98.2 F (36.8 C)  Resp: 18    Intake/Output Summary (Last 24 hours) at 08/19/13 1857 Last data filed at 08/19/13 1400  Gross per 24 hour  Intake   4585 ml  Output   1035 ml  Net   3550 ml   Filed Weights   08/17/13 0500 08/18/13 0457 08/19/13 0500  Weight: 71.85 kg (158 lb 6.4 oz) 72 kg (158 lb 11.7  oz) 73 kg (160 lb 15 oz)    Exam:   General:  NAD  Cardiovascular: RRR  Respiratory: Decreased breath sounds at the bases, no crackles  Abdomen: soft, tender to palpation the left lower quadrant, drain present to the left, positive bowel sounds, nondistended, no rebound, no guarding.  Musculoskeletal: no clubbing cyanosis or edema  Data Reviewed: Basic Metabolic Panel:  Recent Labs Lab 08/16/13 1829 08/16/13 2227 08/17/13 0325 08/18/13 0355 08/19/13 0605  NA 143  --  143 146 146  K 4.8  --  4.5 4.3 4.6  CL 108  --  108 111 111  CO2 20  --  21 18* 18*  GLUCOSE 190*  --  94 121* 83  BUN 39*  --  37* 34* 34*  CREATININE 1.91*  --  1.80* 1.80* 1.73*  CALCIUM 8.5  --  8.0* 7.9* 8.2*  MG  --  2.4  --   --   --   PHOS  --  3.2  --   --   --    Liver Function Tests:  Recent Labs Lab 08/16/13 1829  AST 17  ALT 31  ALKPHOS 98  BILITOT 0.7  PROT 5.9*  ALBUMIN 2.6*   No results found for this basename: LIPASE, AMYLASE,  in the last 168 hours No results found for this basename: AMMONIA,  in the last 168 hours CBC:  Recent Labs Lab 08/16/13 1829 08/17/13 0325 08/18/13 0355 08/19/13 0605  WBC 20.2* 14.5* 20.8* 12.3*  NEUTROABS 18.3*  --  19.4*  --   HGB 10.4* 9.0* 9.4* 9.1*  HCT 31.6* 28.0* 28.4* 28.4*  MCV 97.2 96.9 97.3 97.3  PLT 197 152 138* 165   Cardiac Enzymes: No results found for this basename: CKTOTAL, CKMB, CKMBINDEX, TROPONINI,  in the last 168 hours BNP (last 3 results) No results found for this basename: PROBNP,  in the last 8760 hours CBG: No results found for this basename: GLUCAP,  in the last 168 hours  Recent Results (from the past 240 hour(s))  CULTURE, ROUTINE-ABSCESS     Status: None   Collection Time    08/17/13  5:15 PM      Result Value Ref Range Status   Specimen Description ABSCESS   Final   Special Requests PELVIC ABSCESS   Final   Gram Stain     Final   Value: ABUNDANT WBC PRESENT,BOTH PMN AND MONONUCLEAR     NO SQUAMOUS  EPITHELIAL CELLS SEEN     ABUNDANT GRAM POSITIVE COCCI IN PAIRS     ABUNDANT GRAM NEGATIVE RODS     FEW GRAM POSITIVE RODS   Culture     Final   Value: Culture reincubated for better growth     Performed at [facility]   Report Status PENDING   Incomplete  CULTURE, BLOOD (ROUTINE X 2)     Status: None   Collection Time    08/17/13  7:50 PM      Result Value Ref Range Status   Specimen Description BLOOD RIGHT ARM   Final   Special Requests BOTTLES DRAWN AEROBIC AND ANAEROBIC 10CC EACH   Final   Culture  Setup Time     Final   Value: 08/18/2013 01:11     Performed at [facility]   Culture     Final   Value:        BLOOD CULTURE RECEIVED NO GROWTH TO DATE CULTURE WILL BE HELD FOR 5 DAYS BEFORE ISSUING A FINAL NEGATIVE REPORT     Performed at [facility]   Report Status PENDING   Incomplete  CULTURE, BLOOD (ROUTINE X 2)     Status: None   Collection Time    08/17/13  8:00 PM      Result Value Ref Range Status   Specimen Description BLOOD RIGHT HAND   Final   Special Requests BOTTLES DRAWN AEROBIC AND ANAEROBIC 10CC EACH   Final   Culture  Setup Time     Final   Value: 08/18/2013 01:10     Performed at [facility]   Culture     Final   Value:        BLOOD CULTURE RECEIVED NO GROWTH TO DATE CULTURE WILL BE HELD FOR 5 DAYS BEFORE ISSUING A FINAL NEGATIVE REPORT     Performed at [facility]   Report Status PENDING   Incomplete     Studies: No results found.  Scheduled Meds: . acyclovir ointment   Topical Q3H while awake  . aspirin  81 mg Oral Daily  . famotidine  20 mg Oral Daily  . fentaNYL  25 mcg Transdermal Q72H  . heparin  5,000 Units Subcutaneous 3 times per day  . metoprolol succinate  25 mg Oral Daily  . piperacillin-tazobactam (ZOSYN)  IV  2.25 g Intravenous 3 times per day   Continuous Infusions: . sodium chloride 0.9 % 1,000 mL infusion 75 mL/hr at 08/17/13 1854    Principal Problem:   Pericolonic abscess Active  Problems:   Leukocytosis   Acute on chronic renal failure   Congestive heart failure (CHF)   Degenerative joint disease (  DJD) of hip   GERD (gastroesophageal reflux disease)   CAD (coronary artery disease)   Pericolonic abscess due to diverticulitis    Time spent: 35 minutes    Kela Millin MD [facility] Pager [phone]. If 7PM-7AM, please contact night-coverage at [email or web address], password [facility] 08/19/2013, 6:57 PM  LOS: 3 days

## 2013-08-19 NOTE — Progress Notes (Signed)
Subjective: Pain is nearly resolved.  No N/V.  Hungry, tolerating sips and ice chips.  Not able to ambulate much due to severe arthritis.  Granddaughters and Daughter in the room note significant improvement since IR drain placed.    Objective: Vital signs in last 24 hours: Temp:  [97.9 F (36.6 C)-98.2 F (36.8 C)] 97.9 F (36.6 C) (03/05 0534) Pulse Rate:  [88-113] 88 (03/05 0925) Resp:  [18-20] 20 (03/05 0534) BP: (101-139)/(53-95) 139/70 mmHg (03/05 0925) SpO2:  [94 %-99 %] 94 % (03/05 0534) Weight:  [160 lb 15 oz (73 kg)] 160 lb 15 oz (73 kg) (03/05 0500) Last BM Date: 08/17/13  Intake/Output from previous day: 03/04 0701 - 03/05 0700 In: 1265 [P.O.:30; I.V.:1225] Out: 1040 [Urine:1000; Drains:40] Intake/Output this shift:    PE: Gen:  Alert, NAD, pleasant Abd: Soft, NT/ND, +BS, no HSM, well healed abdominal scars noted, IR perc drain in LLQ with cloudy but clearish white drainage (2440mL/24 hour)   Lab Results:   Recent Labs  08/18/13 0355 08/19/13 0605  WBC 20.8* 12.3*  HGB 9.4* 9.1*  HCT 28.4* 28.4*  PLT 138* 165   BMET  Recent Labs  08/18/13 0355 08/19/13 0605  NA 146 146  K 4.3 4.6  CL 111 111  CO2 18* 18*  GLUCOSE 121* 83  BUN 34* 34*  CREATININE 1.80* 1.73*  CALCIUM 7.9* 8.2*   PT/INR No results found for this basename: LABPROT, INR,  in the last 72 hours CMP     Component Value Date/Time   NA 146 08/19/2013 0605   K 4.6 08/19/2013 0605   CL 111 08/19/2013 0605   CO2 18* 08/19/2013 0605   GLUCOSE 83 08/19/2013 0605   BUN 34* 08/19/2013 0605   CREATININE 1.73* 08/19/2013 0605   CALCIUM 8.2* 08/19/2013 0605   PROT 5.9* 08/16/2013 1829   ALBUMIN 2.6* 08/16/2013 1829   AST 17 08/16/2013 1829   ALT 31 08/16/2013 1829   ALKPHOS 98 08/16/2013 1829   BILITOT 0.7 08/16/2013 1829   GFRNONAA 24* 08/19/2013 0605   GFRAA 28* 08/19/2013 0605   Lipase     Component Value Date/Time   LIPASE 23 04/17/2010 0535       Studies/Results: Ct Image Guided Drainage By  Percutaneous Catheter  08/17/2013   CLINICAL DATA:  80 year old with a left pelvic abscess.  EXAM: CT-GUIDED DRAINAGE OF LEFT PELVIC ABSCESS COLLECTION  Physician: Rachelle HoraAdam R. Lowella DandyHenn, MD  MEDICATIONS: 1.5 mg versed. Fentanyl 37.5 mcg. A radiology nurse monitored the patient for moderate sedation.  ANESTHESIA/SEDATION: Moderate sedation time: 30 min  PROCEDURE: Informed consent was obtained for a CT-guided drain placement. The patient was placed with the left side elevated. Images through the pelvis were obtained. The left lower abdomen was prepped and draped in a sterile fashion. The skin was anesthetized with 1% lidocaine. An 18 gauge needle was directed from the left iliac wing towards the left pelvic fluid collection. Needle was directed between the left psoas tendon and left gonadal vessels. Clay colored, foul-smelling fluid was aspirated. A stiff Amplatz wire was placed. The tract was dilated and a 10 [place] drain was placed. Approximately 30 mL of fluid was removed. Catheter was sutured to the skin. Fluid sample was sent for Gram stain and culture.  COMPLICATIONS: None  FINDINGS: Air-fluid collection in the left hemipelvis adjacent to the sigmoid colon and the left gonadal vessels. The collection is consistent with an abscess.  IMPRESSION: Successful CT-guided placement of a percutaneous drain within the  left pelvic abscess.   Electronically Signed   By: Richarda Overlie M.D.   On: 08/17/2013 18:08    Anti-infectives: Anti-infectives   Start     Dose/Rate Route Frequency Ordered Stop   08/18/13 1030  piperacillin-tazobactam (ZOSYN) IVPB 2.25 g     2.25 g 100 mL/hr over 30 Minutes Intravenous 3 times per day 08/18/13 0945     08/17/13 1915  ciprofloxacin (CIPRO) IVPB 400 mg  Status:  Discontinued     400 mg 200 mL/hr over 60 Minutes Intravenous Every 24 hours 08/16/13 1940 08/18/13 0933   08/16/13 1945  ciprofloxacin (CIPRO) IVPB 400 mg  Status:  Discontinued     400 mg 200 mL/hr over 60 Minutes Intravenous  STAT 08/16/13 1941 08/16/13 2039   08/16/13 1915  ciprofloxacin (CIPRO) IVPB 400 mg  Status:  Discontinued     400 mg 200 mL/hr over 60 Minutes Intravenous Every 12 hours 08/16/13 1910 08/16/13 1940   08/16/13 1915  metroNIDAZOLE (FLAGYL) IVPB 500 mg  Status:  Discontinued     500 mg 100 mL/hr over 60 Minutes Intravenous Every 8 hours 08/16/13 1910 08/18/13 0933   08/16/13 1830  Ampicillin-Sulbactam (UNASYN) 3 g in sodium chloride 0.9 % 100 mL IVPB  Status:  Discontinued     3 g 100 mL/hr over 60 Minutes Intravenous STAT 08/16/13 1829 08/16/13 1910       Assessment/Plan Diverticulitis with abscess/IR drain placed yesterday with 30 ml of clay colored/malordorus fluid  Hypertension  CAD/HX OF CHF/CABG x 2, Mitral valve repair with 28 mm Seguin annuloplasty ring, 2007  Renal Insuffiencey  Hx of goiter  Spinal stenosis with back and leg pain  Cholecystectomy 2011 (DB)   Plan:  1.  Continue antibiotics, On Zosyn Day #2. 2.  IVF, pain control, antiemetics 3.  Allow clear liquids, slow to advance 4.  Not ambulatory at baseline due to severe arthritis, but PT/OT consult may be helpful 5.  SCD's and heparin 6.  IS     LOS: 3 days    [name], [name] 08/19/2013, 10:00 AM Pager: [phone]

## 2013-08-20 LAB — CBC
HEMATOCRIT: 28 % — AB (ref 36.0–46.0)
Hemoglobin: 9.1 g/dL — ABNORMAL LOW (ref 12.0–15.0)
MCH: 31.5 pg (ref 26.0–34.0)
MCHC: 32.5 g/dL (ref 30.0–36.0)
MCV: 96.9 fL (ref 78.0–100.0)
Platelets: 162 10*3/uL (ref 150–400)
RBC: 2.89 MIL/uL — AB (ref 3.87–5.11)
RDW: 14 % (ref 11.5–15.5)
WBC: 7.8 10*3/uL (ref 4.0–10.5)

## 2013-08-20 LAB — BASIC METABOLIC PANEL
BUN: 27 mg/dL — ABNORMAL HIGH (ref 6–23)
CHLORIDE: 111 meq/L (ref 96–112)
CO2: 19 meq/L (ref 19–32)
Calcium: 8 mg/dL — ABNORMAL LOW (ref 8.4–10.5)
Creatinine, Ser: 1.6 mg/dL — ABNORMAL HIGH (ref 0.50–1.10)
GFR calc Af Amer: 31 mL/min — ABNORMAL LOW (ref >90)
GFR calc non Af Amer: 27 mL/min — ABNORMAL LOW (ref >90)
Glucose, Bld: 86 mg/dL (ref 70–99)
Potassium: 4.2 mEq/L (ref 3.7–5.3)
Sodium: 143 mEq/L (ref 137–147)

## 2013-08-20 MED ORDER — PIPERACILLIN-TAZOBACTAM 3.375 G IVPB
3.3750 g | Freq: Three times a day (TID) | INTRAVENOUS | Status: DC
  Administered 2013-08-20 – 2013-08-24 (×12): 3.375 g via INTRAVENOUS
  Filled 2013-08-20 (×15): qty 50

## 2013-08-20 MED ORDER — MAGIC MOUTHWASH
10.0000 mL | ORAL | Status: DC | PRN
  Administered 2013-08-20 – 2013-08-22 (×3): 10 mL via ORAL
  Filled 2013-08-20 (×2): qty 10

## 2013-08-20 NOTE — Progress Notes (Signed)
[facility] PROGRESS NOTE  [name] ZOX:[id number] DOB: July 24, 1917 DOA: 08/16/2013 PCP: Lindwood Qua, MD  Assessment/Plan: #1SIRS/early sepsis secondary to pericolonic abscess and diverticulitis -Patient hypotensive overnight and WBC up to 20.8 on 3/5, improved to 12 today 3/6>> 7.8, resolved today 3/6 -BP stable, continue Zosyn -Cultures to date with no growth, follow -Patient tolerating full liquids, her IR followup CT can be done outpatient  Or as per surgery recommendations #2 leukocytosis Secondary to problem #1. As discussed above improving  #3 acute on chronic kidney disease stage III Baseline creatinine approximately 1.5. Enalapril, Lasix, HCTZ on hold. Renal function is slowly trending back down. Continue gentle hydration.  #4 diastolic congestive heart failure/coronary artery disease Stable. Compensated. Continue to hold diuretics. Continue beta blocker and aspirin. -Monitor fluid status closely with fluids for to #1 as above #5 DJD of the hip Will resume fentanyl patch. Pain management.  #6 gastroesophageal reflux disease Continue Pepcid.  #7 prophylaxis Heparin for DVT prophylaxis.  Code Status: Full Family Communication: updated the patient, daughter and granddaughters at bedside. Disposition Plan: home when medically stable.   Consultants:  General surgery: Dr. Derrell Lolling 08/16/2013  Procedures:  Drain placement pending  CT of the abdomen and pelvis 08/16/2013  Antibiotics:  IV ciprofloxacin/flagyl 08/16/2013>> 3/4  Zosyn started 3/4  HPI/Subjective: Doing well today, tolerating full liquids. Denies any chest pain or shortness of breath.  Objective: Filed Vitals:   08/20/13 1347  BP: 132/60  Pulse: 81  Temp: 98 F (36.7 C)  Resp: 20    Intake/Output Summary (Last 24 hours) at 08/20/13 1508 Last data filed at 08/20/13 1400  Gross per 24 hour  Intake 2160.75 ml  Output    868 ml  Net 1292.75 ml   Filed Weights   08/19/13 0500  08/20/13 0500 08/20/13 0521  Weight: 73 kg (160 lb 15 oz) 74.2 kg (163 lb 9.3 oz) 74.5 kg (164 lb 3.9 oz)    Exam:   General:  NAD  Cardiovascular: RRR  Respiratory: Decreased breath sounds at the bases, no crackles  Abdomen: soft, tender to palpation the left lower quadrant, drain present to the left, positive bowel sounds, nondistended, no rebound, no guarding.  Musculoskeletal: no clubbing cyanosis or edema  Data Reviewed: Basic Metabolic Panel:  Recent Labs Lab 08/16/13 1829 08/16/13 2227 08/17/13 0325 08/18/13 0355 08/19/13 0605 08/20/13 0655  NA 143  --  143 146 146 143  K 4.8  --  4.5 4.3 4.6 4.2  CL 108  --  108 111 111 111  CO2 20  --  21 18* 18* 19  GLUCOSE 190*  --  94 121* 83 86  BUN 39*  --  37* 34* 34* 27*  CREATININE 1.91*  --  1.80* 1.80* 1.73* 1.60*  CALCIUM 8.5  --  8.0* 7.9* 8.2* 8.0*  MG  --  2.4  --   --   --   --   PHOS  --  3.2  --   --   --   --    Liver Function Tests:  Recent Labs Lab 08/16/13 1829  AST 17  ALT 31  ALKPHOS 98  BILITOT 0.7  PROT 5.9*  ALBUMIN 2.6*   No results found for this basename: LIPASE, AMYLASE,  in the last 168 hours No results found for this basename: AMMONIA,  in the last 168 hours CBC:  Recent Labs Lab 08/16/13 1829 08/17/13 0325 08/18/13 0355 08/19/13 0605 08/20/13 0655  WBC 20.2* 14.5* 20.8* 12.3* 7.8  NEUTROABS 18.3*  --  19.4*  --   --   HGB 10.4* 9.0* 9.4* 9.1* 9.1*  HCT 31.6* 28.0* 28.4* 28.4* 28.0*  MCV 97.2 96.9 97.3 97.3 96.9  PLT 197 152 138* 165 162   Cardiac Enzymes: No results found for this basename: CKTOTAL, CKMB, CKMBINDEX, TROPONINI,  in the last 168 hours BNP (last 3 results) No results found for this basename: PROBNP,  in the last 8760 hours CBG: No results found for this basename: GLUCAP,  in the last 168 hours  Recent Results (from the past 240 hour(s))  CULTURE, ROUTINE-ABSCESS     Status: None   Collection Time    08/17/13  5:15 PM      Result Value Ref Range  Status   Specimen Description ABSCESS   Final   Special Requests PELVIC ABSCESS   Final   Gram Stain     Final   Value: ABUNDANT WBC PRESENT,BOTH PMN AND MONONUCLEAR     NO SQUAMOUS EPITHELIAL CELLS SEEN     ABUNDANT GRAM POSITIVE COCCI IN PAIRS     ABUNDANT GRAM NEGATIVE RODS     FEW GRAM POSITIVE RODS   Culture     Final   Value: ABUNDANT GRAM NEGATIVE RODS     Performed at [facility]   Report Status PENDING   Incomplete  CULTURE, BLOOD (ROUTINE X 2)     Status: None   Collection Time    08/17/13  7:50 PM      Result Value Ref Range Status   Specimen Description BLOOD RIGHT ARM   Final   Special Requests BOTTLES DRAWN AEROBIC AND ANAEROBIC 10CC EACH   Final   Culture  Setup Time     Final   Value: 08/18/2013 01:11     Performed at [facility]   Culture     Final   Value:        BLOOD CULTURE RECEIVED NO GROWTH TO DATE CULTURE WILL BE HELD FOR 5 DAYS BEFORE ISSUING A FINAL NEGATIVE REPORT     Performed at [facility]   Report Status PENDING   Incomplete  CULTURE, BLOOD (ROUTINE X 2)     Status: None   Collection Time    08/17/13  8:00 PM      Result Value Ref Range Status   Specimen Description BLOOD RIGHT HAND   Final   Special Requests BOTTLES DRAWN AEROBIC AND ANAEROBIC 10CC EACH   Final   Culture  Setup Time     Final   Value: 08/18/2013 01:10     Performed at [facility]   Culture     Final   Value:        BLOOD CULTURE RECEIVED NO GROWTH TO DATE CULTURE WILL BE HELD FOR 5 DAYS BEFORE ISSUING A FINAL NEGATIVE REPORT     Performed at [facility]   Report Status PENDING   Incomplete     Studies: No results found.  Scheduled Meds: . acyclovir ointment   Topical Q3H while awake  . aspirin  81 mg Oral Daily  . famotidine  20 mg Oral Daily  . fentaNYL  25 mcg Transdermal Q72H  . heparin  5,000 Units Subcutaneous 3 times per day  . metoprolol succinate  25 mg Oral Daily  . piperacillin-tazobactam (ZOSYN)  IV  3.375 g  Intravenous Q8H   Continuous Infusions: . sodium chloride 0.9 % 1,000 mL infusion 75 mL/hr at 08/17/13 1854  Principal Problem:   Pericolonic abscess Active Problems:   Leukocytosis   Acute on chronic renal failure   Congestive heart failure (CHF)   Degenerative joint disease (DJD) of hip   GERD (gastroesophageal reflux disease)   CAD (coronary artery disease)   Pericolonic abscess due to diverticulitis    Time spent: 35 minutes    Kela MillinVIYUOH,Lennart Gladish C MD [facility] Pager [phone]. If 7PM-7AM, please contact night-coverage at [email or web address], password [email or web address] 08/20/2013, 3:08 PM  LOS: 4 days

## 2013-08-20 NOTE — Progress Notes (Signed)
Subjective: Pt feels good.  No abdominal pain or pressure.  No N/V.  Tolerating clears well.  Appetite improving.  +BM, flatus, urinating well.  Up in chair.    Objective: Vital signs in last 24 hours: Temp:  [98.2 F (36.8 C)-98.6 F (37 C)] 98.6 F (37 C) (03/06 0521) Pulse Rate:  [76-103] 76 (03/06 0521) Resp:  [18] 18 (03/06 0521) BP: (120-139)/(52-70) 120/69 mmHg (03/06 0521) SpO2:  [98 %-99 %] 99 % (03/06 0521) Weight:  [163 lb 9.3 oz (74.2 kg)-164 lb 3.9 oz (74.5 kg)] 164 lb 3.9 oz (74.5 kg) (03/06 0521) Last BM Date: 08/19/13  Intake/Output from previous day: 03/05 0701 - 03/06 0700 In: 4838.8 [P.O.:345; I.V.:4288.8; IV Piggyback:200] Out: 728 [Urine:700; Drains:28] Intake/Output this shift: Total I/O In: -  Out: 150 [Urine:150]  PE: Gen:  Alert, NAD, pleasant Abd: Soft, NT/ND, +BS, no HSM, well healed abdominal scars noted, IR perc drain in LLQ with cloudy but clearish white drainage (69mL/24 hour)   Lab Results:   Recent Labs  08/19/13 0605 08/20/13 0655  WBC 12.3* 7.8  HGB 9.1* 9.1*  HCT 28.4* 28.0*  PLT 165 162   BMET  Recent Labs  08/18/13 0355 08/19/13 0605  NA 146 146  K 4.3 4.6  CL 111 111  CO2 18* 18*  GLUCOSE 121* 83  BUN 34* 34*  CREATININE 1.80* 1.73*  CALCIUM 7.9* 8.2*   PT/INR No results found for this basename: LABPROT, INR,  in the last 72 hours CMP     Component Value Date/Time   NA 146 08/19/2013 0605   K 4.6 08/19/2013 0605   CL 111 08/19/2013 0605   CO2 18* 08/19/2013 0605   GLUCOSE 83 08/19/2013 0605   BUN 34* 08/19/2013 0605   CREATININE 1.73* 08/19/2013 0605   CALCIUM 8.2* 08/19/2013 0605   PROT 5.9* 08/16/2013 1829   ALBUMIN 2.6* 08/16/2013 1829   AST 17 08/16/2013 1829   ALT 31 08/16/2013 1829   ALKPHOS 98 08/16/2013 1829   BILITOT 0.7 08/16/2013 1829   GFRNONAA 24* 08/19/2013 0605   GFRAA 28* 08/19/2013 0605   Lipase     Component Value Date/Time   LIPASE 23 04/17/2010 0535       Studies/Results: No results  found.  Anti-infectives: Anti-infectives   Start     Dose/Rate Route Frequency Ordered Stop   08/18/13 1030  piperacillin-tazobactam (ZOSYN) IVPB 2.25 g     2.25 g 100 mL/hr over 30 Minutes Intravenous 3 times per day 08/18/13 0945     08/17/13 1915  ciprofloxacin (CIPRO) IVPB 400 mg  Status:  Discontinued     400 mg 200 mL/hr over 60 Minutes Intravenous Every 24 hours 08/16/13 1940 08/18/13 0933   08/16/13 1945  ciprofloxacin (CIPRO) IVPB 400 mg  Status:  Discontinued     400 mg 200 mL/hr over 60 Minutes Intravenous STAT 08/16/13 1941 08/16/13 2039   08/16/13 1915  ciprofloxacin (CIPRO) IVPB 400 mg  Status:  Discontinued     400 mg 200 mL/hr over 60 Minutes Intravenous Every 12 hours 08/16/13 1910 08/16/13 1940   08/16/13 1915  metroNIDAZOLE (FLAGYL) IVPB 500 mg  Status:  Discontinued     500 mg 100 mL/hr over 60 Minutes Intravenous Every 8 hours 08/16/13 1910 08/18/13 0933   08/16/13 1830  Ampicillin-Sulbactam (UNASYN) 3 g in sodium chloride 0.9 % 100 mL IVPB  Status:  Discontinued     3 g 100 mL/hr over 60 Minutes Intravenous STAT 08/16/13 1829  08/16/13 1910       Assessment/Plan Diverticulitis with abscess/IR drain Hypertension  CAD/HX OF CHF/CABG x 2, Mitral valve repair with 28 mm Seguin annuloplasty ring, 2007  Renal Insuffiencey  Hx of goiter  Spinal stenosis with back and leg pain  Cholecystectomy 2011 (DB)   Plan:  1. Continue antibiotics, On Zosyn Day #3, WBC normal at 7.8 today 2. IVF, pain control, antiemetics  3. Advance to fulls today, low fiber tomorrow AM 4. Not ambulatory at baseline due to severe arthritis, but PT/OT consult may be helpful  5. SCD's and heparin  6. IS 7. Hopefully d/c tomorrow after tolerating low fiber diet     LOS: 4 days    [name], Ariel Astle 08/20/2013, 7:59 AM Pager: [phone]

## 2013-08-20 NOTE — Progress Notes (Signed)
ANTIBIOTIC CONSULT NOTE - FOLLOW UP  Pharmacy Consult:  Zosyn Indication:  Intra-abdominal abscess  Allergies  Allergen Reactions  . Codeine     unknown  . Erythromycin     unknown    Patient Measurements: Height: 5\' 8"  (172.7 cm) Weight: 164 lb 3.9 oz (74.5 kg) IBW/kg (Calculated) : 63.9  Vital Signs: Temp: 98.6 F (37 C) (03/06 0521) Temp src: Oral (03/06 0521) BP: 120/69 mmHg (03/06 0521) Pulse Rate: 76 (03/06 0521) Intake/Output from previous day: 03/05 0701 - 03/06 0700 In: 4838.8 [P.O.:345; I.V.:4288.8; IV Piggyback:200] Out: 728 [Urine:700; Drains:28]  Labs:  Recent Labs  08/18/13 0355 08/19/13 0605 08/20/13 0655  WBC 20.8* 12.3* 7.8  HGB 9.4* 9.1* 9.1*  PLT 138* 165 162  CREATININE 1.80* 1.73* 1.60*   Estimated Creatinine Clearance: 22.2 ml/min (by C-G formula based on Cr of 1.6). No results found for this basename: VANCOTROUGH, VANCOPEAK, VANCORANDOM, GENTTROUGH, GENTPEAK, GENTRANDOM, TOBRATROUGH, TOBRAPEAK, TOBRARND, AMIKACINPEAK, AMIKACINTROU, AMIKACIN,  in the last 72 hours   Microbiology: Recent Results (from the past 720 hour(s))  CULTURE, ROUTINE-ABSCESS     Status: None   Collection Time    08/17/13  5:15 PM      Result Value Ref Range Status   Specimen Description ABSCESS   Final   Special Requests PELVIC ABSCESS   Final   Gram Stain     Final   Value: ABUNDANT WBC PRESENT,BOTH PMN AND MONONUCLEAR     NO SQUAMOUS EPITHELIAL CELLS SEEN     ABUNDANT GRAM POSITIVE COCCI IN PAIRS     ABUNDANT GRAM NEGATIVE RODS     FEW GRAM POSITIVE RODS   Culture     Final   Value: ABUNDANT GRAM NEGATIVE RODS     Performed at [facility]   Report Status PENDING   Incomplete  CULTURE, BLOOD (ROUTINE X 2)     Status: None   Collection Time    08/17/13  7:50 PM      Result Value Ref Range Status   Specimen Description BLOOD RIGHT ARM   Final   Special Requests BOTTLES DRAWN AEROBIC AND ANAEROBIC 10CC EACH   Final   Culture  Setup Time     Final    Value: 08/18/2013 01:11     Performed at [facility]   Culture     Final   Value:        BLOOD CULTURE RECEIVED NO GROWTH TO DATE CULTURE WILL BE HELD FOR 5 DAYS BEFORE ISSUING A FINAL NEGATIVE REPORT     Performed at [facility]   Report Status PENDING   Incomplete  CULTURE, BLOOD (ROUTINE X 2)     Status: None   Collection Time    08/17/13  8:00 PM      Result Value Ref Range Status   Specimen Description BLOOD RIGHT HAND   Final   Special Requests BOTTLES DRAWN AEROBIC AND ANAEROBIC 10CC EACH   Final   Culture  Setup Time     Final   Value: 08/18/2013 01:10     Performed at [facility]   Culture     Final   Value:        BLOOD CULTURE RECEIVED NO GROWTH TO DATE CULTURE WILL BE HELD FOR 5 DAYS BEFORE ISSUING A FINAL NEGATIVE REPORT     Performed at [facility]   Report Status PENDING   Incomplete      Assessment: 89 YOF started on Zosyn  for worsening leukocytosis and temperature post percutaneous tube placement for intra-abdominal abscess drainage.  Patient continues on Zosyn for GNR growing in pelvic abscess culture.  Fever and leukocytosis resolved.  She has a history of CKD and her renal function is improving and approaching her baseline.  Cipro/Flagyl 3/2 >> 3/4 Zosyn 3/4 >>  3/3 Pelvic abscess - GNR 3/3 Bld x2 - NGTD   Goal of Therapy:  Clearance of infection / Infection prevention   Plan:  - Change Zosyn to 3.375gm IV Q8H, 4 hr infusion - Monitor renal fxn, clinical course, micro data to de-escalate abx    Aeralyn Barna D. Laney Potash, PharmD, BCPS Pager:  [phone] 08/20/2013, 11:01 AM

## 2013-08-20 NOTE — Progress Notes (Signed)
Patient interviewed and examined, agree with PA note above. Agree she is doing well. However she had a large abscess and particularly of her advanced age I would continue IV antibiotics through the weekend and repeat CT scan Monday to make sure this is resolving appropriately  Mariella SaaBenjamin T Nitisha Civello MD, FACS  08/20/2013 6:06 PM

## 2013-08-20 NOTE — Progress Notes (Signed)
Subjective: Patient states she is feeling each day. Diet being advanced. She is hopeful for discharge tomorrow.  Objective: Physical Exam: BP 120/69  Pulse 76  Temp(Src) 98.6 F (37 C) (Oral)  Resp 18  Ht 5\' 8"  (1.727 m)  Wt 164 lb 3.9 oz (74.5 kg)  BMI 24.98 kg/m2  SpO2 99%  General: A&Ox3, NAD, lying in bed. Abd: Soft, ND, NT LLQ drain: dressing intact, output 28mL, serous  Labs: CBC  Recent Labs  08/19/13 0605 08/20/13 0655  WBC 12.3* 7.8  HGB 9.1* 9.1*  HCT 28.4* 28.0*  PLT 165 162   BMET  Recent Labs  08/19/13 0605 08/20/13 0655  NA 146 143  K 4.6 4.2  CL 111 111  CO2 18* 19  GLUCOSE 83 86  BUN 34* 27*  CREATININE 1.73* 1.60*  CALCIUM 8.2* 8.0*   LFT No results found for this basename: PROT, ALBUMIN, AST, ALT, ALKPHOS, BILITOT, BILIDIR, IBILI, LIPASE,  in the last 72 hours PT/INR No results found for this basename: LABPROT, INR,  in the last 72 hours   Studies/Results: No results found.  Assessment/Plan: S/p LLQ diverticular abscess drain placed in IR 3/3 WBC has normalized, afebrile.  Recommend daily flush after DC. Family states they are comfortable with flushes. Plans per [facility], probable DC tomorrow. Recommend outpt CT when drain output minimal or at surgery discretion. Call IR if needed.   LOS: 4 days    Brayton ElBRUNING, Mehdi Gironda PA-C 08/20/2013 11:25 AM

## 2013-08-21 LAB — CULTURE, ROUTINE-ABSCESS

## 2013-08-21 NOTE — Progress Notes (Signed)
Subjective: Denies pain.  Tolerated fulls good yesterday, regular food today.  Granddaughter at bedside says she's doing well.  Urinating and having BM's.  JP drain with 50mL out in last 24hours.  Getting up to the chair.    Objective: Vital signs in last 24 hours: Temp:  [98 F (36.7 C)-98.4 F (36.9 C)] 98.4 F (36.9 C) (03/07 0529) Pulse Rate:  [81-89] 89 (03/07 0529) Resp:  [20] 20 (03/07 0529) BP: (127-149)/(60-84) 149/84 mmHg (03/07 0529) SpO2:  [96 %-100 %] 98 % (03/07 0529) Weight:  [164 lb 0.4 oz (74.4 kg)] 164 lb 0.4 oz (74.4 kg) (03/07 0529) Last BM Date: 08/20/13  Intake/Output from previous day: 03/06 0701 - 03/07 0700 In: 662 [I.V.:647] Out: 200 [Urine:150; Drains:50] Intake/Output this shift:    PE: Gen:  Alert, NAD, pleasant Abd: Soft, NT/ND, +BS, no HSM, incisions C/D/I, drain with minimal serosanguinous drainage   Lab Results:   Recent Labs  08/19/13 0605 08/20/13 0655  WBC 12.3* 7.8  HGB 9.1* 9.1*  HCT 28.4* 28.0*  PLT 165 162   BMET  Recent Labs  08/19/13 0605 08/20/13 0655  NA 146 143  K 4.6 4.2  CL 111 111  CO2 18* 19  GLUCOSE 83 86  BUN 34* 27*  CREATININE 1.73* 1.60*  CALCIUM 8.2* 8.0*   PT/INR No results found for this basename: LABPROT, INR,  in the last 72 hours CMP     Component Value Date/Time   NA 143 08/20/2013 0655   K 4.2 08/20/2013 0655   CL 111 08/20/2013 0655   CO2 19 08/20/2013 0655   GLUCOSE 86 08/20/2013 0655   BUN 27* 08/20/2013 0655   CREATININE 1.60* 08/20/2013 0655   CALCIUM 8.0* 08/20/2013 0655   PROT 5.9* 08/16/2013 1829   ALBUMIN 2.6* 08/16/2013 1829   AST 17 08/16/2013 1829   ALT 31 08/16/2013 1829   ALKPHOS 98 08/16/2013 1829   BILITOT 0.7 08/16/2013 1829   GFRNONAA 27* 08/20/2013 0655   GFRAA 31* 08/20/2013 0655   Lipase     Component Value Date/Time   LIPASE 23 04/17/2010 0535       Studies/Results: No results found.  Anti-infectives: Anti-infectives   Start     Dose/Rate Route Frequency Ordered Stop   08/20/13 1200  piperacillin-tazobactam (ZOSYN) IVPB 3.375 g     3.375 g 12.5 mL/hr over 240 Minutes Intravenous Every 8 hours 08/20/13 1103     08/18/13 1030  piperacillin-tazobactam (ZOSYN) IVPB 2.25 g  Status:  Discontinued     2.25 g 100 mL/hr over 30 Minutes Intravenous 3 times per day 08/18/13 0945 08/20/13 1102   08/17/13 1915  ciprofloxacin (CIPRO) IVPB 400 mg  Status:  Discontinued     400 mg 200 mL/hr over 60 Minutes Intravenous Every 24 hours 08/16/13 1940 08/18/13 0933   08/16/13 1945  ciprofloxacin (CIPRO) IVPB 400 mg  Status:  Discontinued     400 mg 200 mL/hr over 60 Minutes Intravenous STAT 08/16/13 1941 08/16/13 2039   08/16/13 1915  ciprofloxacin (CIPRO) IVPB 400 mg  Status:  Discontinued     400 mg 200 mL/hr over 60 Minutes Intravenous Every 12 hours 08/16/13 1910 08/16/13 1940   08/16/13 1915  metroNIDAZOLE (FLAGYL) IVPB 500 mg  Status:  Discontinued     500 mg 100 mL/hr over 60 Minutes Intravenous Every 8 hours 08/16/13 1910 08/18/13 0933   08/16/13 1830  Ampicillin-Sulbactam (UNASYN) 3 g in sodium chloride 0.9 % 100 mL IVPB  Status:  Discontinued     3 g 100 mL/hr over 60 Minutes Intravenous STAT 08/16/13 1829 08/16/13 1910     Assessment/Plan Diverticulitis with abscess/IR drain on 08/17/13 Hypertension  CAD/HX OF CHF/CABG x 2, Mitral valve repair with 28 mm Seguin annuloplasty ring, 2007  Renal Insuffiencey  Hx of goiter  Spinal stenosis with back and leg pain  Cholecystectomy 2011 (DB)   Plan:  1. Continue antibiotics, On Zosyn Day #4, WBC normal at 7.8 yesterday 2. IVF, pain control, antiemetics  3. Low fiber/soft diet this am 4. Not ambulatory at baseline due to severe arthritis, but PT/OT consult may be helpful  5. SCD's and heparin  6. IS  7. Keep through weekend, recheck CT Monday and if improved can likely be discharged home    LOS: 5 days   DORT, [facility] 08/21/2013, 9:07 AM Pager: [phone]  Agree with above. Family in room. CT scan on for  Monday.  Ovidio Kin, MD, [facility] Surgery Pager: [phone] Office phone:  [phone]

## 2013-08-21 NOTE — Progress Notes (Signed)
[facility] PROGRESS NOTE  DORY DEMONT ZOX:[id number] DOB: [birth year] DOA: 08/16/2013 PCP: Lindwood Qua, MD  Assessment/Plan: #1SIRS/early sepsis secondary to pericolonic abscess and diverticulitis -Patient hypotensive overnight and WBC up to 20.8 on 3/5, improved to 12 today 3/6>> 7.8, resolved today 3/6 -BP stable, continue Zosyn -Cultures to date with no growth, follow -Patient advanced to solids,follow -Per Surgery CT to be repeated on 3/9 and if improved will plan discharge then consult  -PT OT #2 leukocytosis Secondary to problem #1. As discussed above  resolved   #3 acute on chronic kidney disease stage III Baseline creatinine approximately 1.5. Enalapril, Lasix, HCTZ on hold.  -Follow and recheck in a.m.  #4 diastolic congestive heart failure/coronary artery disease Stable. Compensated. Continue to hold diuretics. Continue beta blocker and aspirin. -Monitor fluid status closely with fluids for to #1 as above #5 DJD of the hip Will resume fentanyl patch. Pain management.  #6 gastroesophageal reflux disease Continue Pepcid.  #7 prophylaxis Heparin for DVT prophylaxis.  Code Status: Full Family Communication: updated the patient, daughter and granddaughters at bedside. Disposition Plan: home when medically stable.   Consultants:  General surgery: Dr. Derrell Lolling 08/16/2013  Procedures:  Drain placement pending  CT of the abdomen and pelvis 08/16/2013  Antibiotics:  IV ciprofloxacin/flagyl 08/16/2013>> 3/4  Zosyn started 3/4  HPI/Subjective:  tolerating solids so far today, sitting up in chair. Denies any complaints  Objective: Filed Vitals:   08/21/13 0529  BP: 149/84  Pulse: 89  Temp: 98.4 F (36.9 C)  Resp: 20    Intake/Output Summary (Last 24 hours) at 08/21/13 1526 Last data filed at 08/21/13 8119  Gross per 24 hour  Intake     15 ml  Output     50 ml  Net    -35 ml   Filed Weights   08/20/13 0500 08/20/13 0521 08/21/13 0529   Weight: 74.2 kg (163 lb 9.3 oz) 74.5 kg (164 lb 3.9 oz) 74.4 kg (164 lb 0.4 oz)    Exam:   General:  NAD  Cardiovascular: RRR  Respiratory: Decreased breath sounds at the bases, no crackles  Abdomen: soft, tender to palpation the left lower quadrant, drain present to the left, positive bowel sounds, nondistended, no rebound, no guarding.  Musculoskeletal: no clubbing cyanosis or edema  Data Reviewed: Basic Metabolic Panel:  Recent Labs Lab 08/16/13 1829 08/16/13 2227 08/17/13 0325 08/18/13 0355 08/19/13 0605 08/20/13 0655  NA 143  --  143 146 146 143  K 4.8  --  4.5 4.3 4.6 4.2  CL 108  --  108 111 111 111  CO2 20  --  21 18* 18* 19  GLUCOSE 190*  --  94 121* 83 86  BUN 39*  --  37* 34* 34* 27*  CREATININE 1.91*  --  1.80* 1.80* 1.73* 1.60*  CALCIUM 8.5  --  8.0* 7.9* 8.2* 8.0*  MG  --  2.4  --   --   --   --   PHOS  --  3.2  --   --   --   --    Liver Function Tests:  Recent Labs Lab 08/16/13 1829  AST 17  ALT 31  ALKPHOS 98  BILITOT 0.7  PROT 5.9*  ALBUMIN 2.6*   No results found for this basename: LIPASE, AMYLASE,  in the last 168 hours No results found for this basename: AMMONIA,  in the last 168 hours CBC:  Recent Labs Lab 08/16/13 1829 08/17/13 0325 08/18/13 0355 08/19/13  4098 08/20/13 0655  WBC 20.2* 14.5* 20.8* 12.3* 7.8  NEUTROABS 18.3*  --  19.4*  --   --   HGB 10.4* 9.0* 9.4* 9.1* 9.1*  HCT 31.6* 28.0* 28.4* 28.4* 28.0*  MCV 97.2 96.9 97.3 97.3 96.9  PLT 197 152 138* 165 162   Cardiac Enzymes: No results found for this basename: CKTOTAL, CKMB, CKMBINDEX, TROPONINI,  in the last 168 hours BNP (last 3 results) No results found for this basename: PROBNP,  in the last 8760 hours CBG: No results found for this basename: GLUCAP,  in the last 168 hours  Recent Results (from the past 240 hour(s))  CULTURE, ROUTINE-ABSCESS     Status: None   Collection Time    08/17/13  5:15 PM      Result Value Ref Range Status   Specimen  Description ABSCESS   Final   Special Requests PELVIC ABSCESS   Final   Gram Stain     Final   Value: ABUNDANT WBC PRESENT,BOTH PMN AND MONONUCLEAR     NO SQUAMOUS EPITHELIAL CELLS SEEN     ABUNDANT GRAM POSITIVE COCCI IN PAIRS     ABUNDANT GRAM NEGATIVE RODS     FEW GRAM POSITIVE RODS   Culture     Final   Value: ABUNDANT KLEBSIELLA OXYTOCA     Performed at [facility]   Report Status 08/21/2013 FINAL   Final   Organism ID, Bacteria KLEBSIELLA OXYTOCA   Final  CULTURE, BLOOD (ROUTINE X 2)     Status: None   Collection Time    08/17/13  7:50 PM      Result Value Ref Range Status   Specimen Description BLOOD RIGHT ARM   Final   Special Requests BOTTLES DRAWN AEROBIC AND ANAEROBIC 10CC EACH   Final   Culture  Setup Time     Final   Value: 08/18/2013 01:11     Performed at [facility]   Culture     Final   Value:        BLOOD CULTURE RECEIVED NO GROWTH TO DATE CULTURE WILL BE HELD FOR 5 DAYS BEFORE ISSUING A FINAL NEGATIVE REPORT     Performed at [facility]   Report Status PENDING   Incomplete  CULTURE, BLOOD (ROUTINE X 2)     Status: None   Collection Time    08/17/13  8:00 PM      Result Value Ref Range Status   Specimen Description BLOOD RIGHT HAND   Final   Special Requests BOTTLES DRAWN AEROBIC AND ANAEROBIC 10CC EACH   Final   Culture  Setup Time     Final   Value: 08/18/2013 01:10     Performed at [facility]   Culture     Final   Value:        BLOOD CULTURE RECEIVED NO GROWTH TO DATE CULTURE WILL BE HELD FOR 5 DAYS BEFORE ISSUING A FINAL NEGATIVE REPORT     Performed at [facility]   Report Status PENDING   Incomplete     Studies: No results found.  Scheduled Meds: . acyclovir ointment   Topical Q3H while awake  . aspirin  81 mg Oral Daily  . famotidine  20 mg Oral Daily  . fentaNYL  25 mcg Transdermal Q72H  . heparin  5,000 Units Subcutaneous 3 times per day  . metoprolol succinate  25 mg Oral Daily  .  piperacillin-tazobactam (ZOSYN)  IV  3.375  g Intravenous Q8H   Continuous Infusions: . sodium chloride 0.9 % 1,000 mL infusion 75 mL/hr at 08/17/13 1854    Principal Problem:   Pericolonic abscess Active Problems:   Leukocytosis   Acute on chronic renal failure   Congestive heart failure (CHF)   Degenerative joint disease (DJD) of hip   GERD (gastroesophageal reflux disease)   CAD (coronary artery disease)   Pericolonic abscess due to diverticulitis    Time spent: 25 minutes    Kela MillinVIYUOH,Lorance Pickeral C MD [facility] Pager [phone]. If 7PM-7AM, please contact night-coverage at [email or web address], password [email or web address] 08/21/2013, 3:26 PM  LOS: 5 days

## 2013-08-22 LAB — BASIC METABOLIC PANEL
BUN: 15 mg/dL (ref 6–23)
CALCIUM: 8 mg/dL — AB (ref 8.4–10.5)
CHLORIDE: 110 meq/L (ref 96–112)
CO2: 18 meq/L — AB (ref 19–32)
Creatinine, Ser: 1.43 mg/dL — ABNORMAL HIGH (ref 0.50–1.10)
GFR calc non Af Amer: 31 mL/min — ABNORMAL LOW (ref >90)
GFR, EST AFRICAN AMERICAN: 35 mL/min — AB (ref >90)
Glucose, Bld: 105 mg/dL — ABNORMAL HIGH (ref 70–99)
Potassium: 4.3 mEq/L (ref 3.7–5.3)
Sodium: 141 mEq/L (ref 137–147)

## 2013-08-22 LAB — CBC
HEMATOCRIT: 29 % — AB (ref 36.0–46.0)
Hemoglobin: 9.4 g/dL — ABNORMAL LOW (ref 12.0–15.0)
MCH: 31.5 pg (ref 26.0–34.0)
MCHC: 32.4 g/dL (ref 30.0–36.0)
MCV: 97.3 fL (ref 78.0–100.0)
Platelets: 199 10*3/uL (ref 150–400)
RBC: 2.98 MIL/uL — ABNORMAL LOW (ref 3.87–5.11)
RDW: 14.1 % (ref 11.5–15.5)
WBC: 7.8 10*3/uL (ref 4.0–10.5)

## 2013-08-22 NOTE — Progress Notes (Signed)
General Surgery Note  LOS: 6 days  POD -     Assessment/Plan: 1.  Diverticulitis with abscess/IR drain on 08/17/13   Zosyn - 08/17/2013 >>>  For repeat CT Monday  Has done well so far.  2. Hypertension  3.  CAD/HX OF CHF/CABG x 2, Mitral valve repair with 28 mm Seguin annuloplasty ring, 2007  4.  Renal Insuffiencey  5.  Hx of goiter  6.  Spinal stenosis with back and leg pain  7.  Anemia  Hgb - 9.4 - 08/22/2013  8.  DVT prophylaxis - SQ Heperin 9.  Wheel chair bound  Principal Problem:   Pericolonic abscess Active Problems:   Leukocytosis   Acute on chronic renal failure   Congestive heart failure (CHF)   Degenerative joint disease (DJD) of hip   GERD (gastroesophageal reflux disease)   CAD (coronary artery disease)   Pericolonic abscess due to diverticulitis   Subjective:  Looks good.  Son, Elnita Maxwell, in room with her. Objective:   Filed Vitals:   08/22/13 0548  BP: 116/67  Pulse: 94  Temp: 98.3 F (36.8 C)  Resp: 18     Intake/Output from previous day:  03/07 0701 - 03/08 0700 In: 255 [P.O.:240] Out: 27 [Drains:27]  Intake/Output this shift:  Total I/O In: 240 [P.O.:240] Out: -    Physical Exam:   General: Older WF who is alert and oriented.    HEENT: Normal. Pupils equal. .   Lungs: Clear   Abdomen: Soft.  BS present.  Drain in LLQ - 27 cc recorded     Lab Results:    Recent Labs  08/20/13 0655 08/22/13 0650  WBC 7.8 7.8  HGB 9.1* 9.4*  HCT 28.0* 29.0*  PLT 162 199    BMET   Recent Labs  08/20/13 0655 08/22/13 0650  NA 143 141  K 4.2 4.3  CL 111 110  CO2 19 18*  GLUCOSE 86 105*  BUN 27* 15  CREATININE 1.60* 1.43*  CALCIUM 8.0* 8.0*    PT/INR  No results found for this basename: LABPROT, INR,  in the last 72 hours  ABG  No results found for this basename: PHART, PCO2, PO2, HCO3,  in the last 72 hours   Studies/Results:  No results found.   Anti-infectives:   Anti-infectives   Start     Dose/Rate Route Frequency Ordered Stop   08/20/13 1200  piperacillin-tazobactam (ZOSYN) IVPB 3.375 g     3.375 g 12.5 mL/hr over 240 Minutes Intravenous Every 8 hours 08/20/13 1103     08/18/13 1030  piperacillin-tazobactam (ZOSYN) IVPB 2.25 g  Status:  Discontinued     2.25 g 100 mL/hr over 30 Minutes Intravenous 3 times per day 08/18/13 0945 08/20/13 1102   08/17/13 1915  ciprofloxacin (CIPRO) IVPB 400 mg  Status:  Discontinued     400 mg 200 mL/hr over 60 Minutes Intravenous Every 24 hours 08/16/13 1940 08/18/13 0933   08/16/13 1945  ciprofloxacin (CIPRO) IVPB 400 mg  Status:  Discontinued     400 mg 200 mL/hr over 60 Minutes Intravenous STAT 08/16/13 1941 08/16/13 2039   08/16/13 1915  ciprofloxacin (CIPRO) IVPB 400 mg  Status:  Discontinued     400 mg 200 mL/hr over 60 Minutes Intravenous Every 12 hours 08/16/13 1910 08/16/13 1940   08/16/13 1915  metroNIDAZOLE (FLAGYL) IVPB 500 mg  Status:  Discontinued     500 mg 100 mL/hr over 60 Minutes Intravenous Every 8 hours 08/16/13 1910 08/18/13  [id number]   08/16/13 1830  Ampicillin-Sulbactam (UNASYN) 3 g in sodium chloride 0.9 % 100 mL IVPB  Status:  Discontinued     3 g 100 mL/hr over 60 Minutes Intravenous STAT 08/16/13 1829 08/16/13 1910      Ovidio Kinavid Ramon Brant, MD, FACS Pager: [phone] [facility] Office: [phone] 08/22/2013

## 2013-08-22 NOTE — Evaluation (Signed)
Physical Therapy Evaluation Patient Details Name: [name] MRN: [id number] DOB: January 03, 1918 Today's Date: 08/22/2013 Time: 4098-1191 PT Time Calculation (min): 17 min  PT Assessment / Plan / Recommendation History of Present Illness  [name] is a 80 y.o. female with PMH significant for HTN, diverticulosis, DJD (affecting hip and mainly wheelchair bound), CAD (S/P CABG), diastolic heart failure (last EF 55%); CKD (stage 3, Cr baseline 1.54) and GERD; came to ED from PCP office due to approx 1.5 weeks complaints of abd pain (LLQ, intermittent, crampy and with associated fever). Patient had CT scan at PCP office that demonstrated air-fluid levels in colonic area and suggested pericolonic abscess; also with acute on chronic renal failure (Cr now in the 1.9 range) and elevated WBC's ( 20,000).   Clinical Impression  Patient presents with problems listed below.  Will benefit from acute PT to maximize mobility/transfers prior to return home.    PT Assessment  Patient needs continued PT services    Follow Up Recommendations  Supervision for mobility/OOB;Home health PT    Does the patient have the potential to tolerate intense rehabilitation      Barriers to Discharge Decreased caregiver support Patient is at home alone during most of the day.  Is independent at w/c level pta.    Equipment Recommendations  None recommended by PT    Recommendations for Other Services     Frequency Min 3X/week    Precautions / Restrictions Precautions Precautions: Fall Restrictions Weight Bearing Restrictions: No   Pertinent Vitals/Pain Pain in Lt shoulder impacting mobility      Mobility  Bed Mobility Overal bed mobility: Needs Assistance Bed Mobility: Supine to Sit Supine to sit: Min assist General bed mobility comments: Patient attempted to get out on Lt side(patient's Lt side) of bed - unable.  Moved to Rt side of bed (patient's right).  Patient able to move LE's off of bed.  Assist to  raise trunk to sitting position.  Once upright, patient able to maintain balance. Transfers Overall transfer level: Needs assistance Equipment used: 2 person hand held assist Transfers: Sit to/from [employer] Sit to Stand: Min assist;+2 safety/equipment Stand pivot transfers: Min assist;+2 safety/equipment General transfer comment: Verbal cues for hand placement. Assist for safety/balance.    Exercises     PT Diagnosis: Generalized weakness;Acute pain  PT Problem List: Decreased strength;Decreased range of motion;Decreased activity tolerance;Decreased balance;Decreased mobility;Decreased knowledge of use of DME;Pain PT Treatment Interventions: DME instruction;Functional mobility training;Balance training;Patient/family education;Wheelchair mobility training     PT Goals(Current goals can be found in the care plan section) Acute Rehab PT Goals Patient Stated Goal: To go home soon PT Goal Formulation: With patient/family Time For Goal Achievement: 08/29/13 Potential to Achieve Goals: Good  Visit Information  Last PT Received On: 08/22/13 Assistance Needed: +2 History of Present Illness: [name] is a 80 y.o. female with PMH significant for HTN, diverticulosis, DJD (affecting hip and mainly wheelchair bound), CAD (S/P CABG), diastolic heart failure (last EF 55%); CKD (stage 3, Cr baseline 1.54) and GERD; came to ED from PCP office due to approx 1.5 weeks complaints of abd pain (LLQ, intermittent, crampy and with associated fever). Patient had CT scan at PCP office that demonstrated air-fluid levels in colonic area and suggested pericolonic abscess; also with acute on chronic renal failure (Cr now in the 1.9 range) and elevated WBC's ( 20,000).        Prior Functioning  Home Living Family/patient expects to be discharged to::  Private residence Living Arrangements: Children Available Help at Discharge: Family;Available PRN/intermittently (Family stays with her at  night.  In/out during day) Type of Home: House Home Access: Ramped entrance Home Layout: One level Home Equipment: Wheelchair - [employer]manual;Walker - 2 wheels;Bedside commode;Shower seat Prior Function Level of Independence: Independent with assistive device(s);Needs assistance Gait / Transfers Assistance Needed: Patient uses w/c during day.  Can transfer independently to/from w/c.  Does stand with RW to "stretch my legs" per patient - does this with someone with her. ADL's / Homemaking Assistance Needed: Assist bathing/dressing and meal prep. Comments: Family stays with her evenings and nights. Communication Communication: HOH    Cognition  Cognition Arousal/Alertness: Awake/alert Behavior During Therapy: WFL for tasks assessed/performed Overall Cognitive Status: Within Functional Limits for tasks assessed    Extremity/Trunk Assessment Upper Extremity Assessment Upper Extremity Assessment: RUE deficits/detail;LUE deficits/detail RUE Deficits / Details: Decreased shoulder ROM and strength - h/o rotator cuff surgery.   LUE Deficits / Details: Limited shoulder strength due to pain with movement. LUE: Unable to fully assess due to pain Lower Extremity Assessment Lower Extremity Assessment: RLE deficits/detail;LLE deficits/detail RLE Deficits / Details: Strength grossly 3-/5.  Limited by pain in hip/knee. RLE: Unable to fully assess due to pain RLE Coordination: decreased gross motor LLE Deficits / Details: Strength grossly 3-/5.  Limited by pain in hip/knee (less pain than RLE). LLE Coordination: decreased gross motor   Balance    End of Session PT - End of Session Equipment Utilized During Treatment: Gait belt Activity Tolerance: Patient limited by pain;Patient limited by fatigue Patient left: in chair;with call bell/phone within reach;with family/visitor present Nurse Communication: Mobility status;Patient requests pain meds  GP     Vena AustriaDavis, Adilynn Bessey H 08/22/2013, 5:20 PM Durenda HurtSusan H. Renaldo Fiddleravis,  PT, [facility] Acute Rehab Services Pager [phone]

## 2013-08-22 NOTE — Progress Notes (Signed)
[facility] PROGRESS NOTE  [name] NWG:[id number] DOB: 1918-03-15 DOA: 08/16/2013 PCP: Lindwood Qua, MD  Assessment/Plan: #1SIRS/early sepsis secondary to pericolonic abscess and diverticulitis -Patient hypotensive overnight and WBC up to 20.8 on 3/5, improved to 12 today 3/6>> 7.8, resolved today 3/6 -BP stable, continue Zosyn -Cultures to date with no growth, follow -Patient advanced to solids,follow -Per Surgery  Repeat CT 3/9  IN AMand if improved will plan discharge then  -PT recommending HH services #2 leukocytosis Secondary to problem #1. As discussed above  resolved   #3 acute on chronic kidney disease stage III Baseline creatinine approximately 1.5. Enalapril, Lasix, HCTZ on hold.  -Follow and recheck in a.m.  #4 diastolic congestive heart failure/coronary artery disease Stable. Compensated. Continue to hold diuretics. Continue beta blocker and aspirin. -Monitor fluid status closely with fluids for to #1 as above #5 DJD of the hip Will resume fentanyl patch. Pain management.  #6 gastroesophageal reflux disease Continue Pepcid.  #7 prophylaxis Heparin for DVT prophylaxis.  Code Status: Full Family Communication: updated the patient, daughter and granddaughters at bedside. Disposition Plan: home when medically stable.   Consultants:  General surgery: Dr. Derrell Lolling 08/16/2013  Procedures:  Drain placement pending  CT of the abdomen and pelvis 08/16/2013  Antibiotics:  IV ciprofloxacin/flagyl 08/16/2013>> 3/4  Zosyn started 3/4  HPI/Subjective:  tolerating solids sitting, Denies any complaints  Objective: Filed Vitals:   08/22/13 1425  BP: 126/61  Pulse: 95  Temp: 98.1 F (36.7 C)  Resp: 18    Intake/Output Summary (Last 24 hours) at 08/22/13 1850 Last data filed at 08/22/13 0900  Gross per 24 hour  Intake    250 ml  Output     20 ml  Net    230 ml   Filed Weights   08/20/13 0521 08/21/13 0529 08/22/13 0548  Weight: 74.5 kg  (164 lb 3.9 oz) 74.4 kg (164 lb 0.4 oz) 76.5 kg (168 lb 10.4 oz)    Exam:   General:  NAD  Cardiovascular: RRR  Respiratory: Decreased breath sounds at the bases, no crackles  Abdomen: soft, tender to palpation the left lower quadrant, drain present to the left, positive bowel sounds, nondistended, no rebound, no guarding.  Musculoskeletal: no clubbing cyanosis or edema  Data Reviewed: Basic Metabolic Panel:  Recent Labs Lab 08/16/13 2227 08/17/13 0325 08/18/13 0355 08/19/13 0605 08/20/13 0655 08/22/13 0650  NA  --  143 146 146 143 141  K  --  4.5 4.3 4.6 4.2 4.3  CL  --  108 111 111 111 110  CO2  --  21 18* 18* 19 18*  GLUCOSE  --  94 121* 83 86 105*  BUN  --  37* 34* 34* 27* 15  CREATININE  --  1.80* 1.80* 1.73* 1.60* 1.43*  CALCIUM  --  8.0* 7.9* 8.2* 8.0* 8.0*  MG 2.4  --   --   --   --   --   PHOS 3.2  --   --   --   --   --    Liver Function Tests:  Recent Labs Lab 08/16/13 1829  AST 17  ALT 31  ALKPHOS 98  BILITOT 0.7  PROT 5.9*  ALBUMIN 2.6*   No results found for this basename: LIPASE, AMYLASE,  in the last 168 hours No results found for this basename: AMMONIA,  in the last 168 hours CBC:  Recent Labs Lab 08/16/13 1829 08/17/13 0325 08/18/13 0355 08/19/13 5784 08/20/13 6962 08/22/13 9528  WBC 20.2* 14.5* 20.8* 12.3* 7.8 7.8  NEUTROABS 18.3*  --  19.4*  --   --   --   HGB 10.4* 9.0* 9.4* 9.1* 9.1* 9.4*  HCT 31.6* 28.0* 28.4* 28.4* 28.0* 29.0*  MCV 97.2 96.9 97.3 97.3 96.9 97.3  PLT 197 152 138* 165 162 199   Cardiac Enzymes: No results found for this basename: CKTOTAL, CKMB, CKMBINDEX, TROPONINI,  in the last 168 hours BNP (last 3 results) No results found for this basename: PROBNP,  in the last 8760 hours CBG: No results found for this basename: GLUCAP,  in the last 168 hours  Recent Results (from the past 240 hour(s))  CULTURE, ROUTINE-ABSCESS     Status: None   Collection Time    08/17/13  5:15 PM      Result Value Ref Range  Status   Specimen Description ABSCESS   Final   Special Requests PELVIC ABSCESS   Final   Gram Stain     Final   Value: ABUNDANT WBC PRESENT,BOTH PMN AND MONONUCLEAR     NO SQUAMOUS EPITHELIAL CELLS SEEN     ABUNDANT GRAM POSITIVE COCCI IN PAIRS     ABUNDANT GRAM NEGATIVE RODS     FEW GRAM POSITIVE RODS   Culture     Final   Value: ABUNDANT KLEBSIELLA OXYTOCA     [facility]   Report Status 08/21/2013 FINAL   Final   Organism ID, Bacteria KLEBSIELLA OXYTOCA   Final  CULTURE, BLOOD (ROUTINE X 2)     Status: None   Collection Time    08/17/13  7:50 PM      Result Value Ref Range Status   Specimen Description BLOOD RIGHT ARM   Final   Special Requests BOTTLES DRAWN AEROBIC AND ANAEROBIC 10CC EACH   Final   Culture  Setup Time     Final   Value: 08/18/2013 01:11     [facility]formed at [facility]   Culture     Final   Value:        BLOOD CULTURE RECEIVED NO GROWTH TO DATE CULTURE WILL BE HELD FOR 5 DAYS BEFORE ISSUING A FINAL NEGATIVE REPORT     Performed at [facility]   Report Status PENDING   Incomplete  CULTURE, BLOOD (ROUTINE X 2)     Status: None   Collection Time    08/17/13  8:00 PM      Result Value Ref Range Status   Specimen Description BLOOD RIGHT HAND   Final   Special Requests BOTTLES DRAWN AEROBIC AND ANAEROBIC 10CC EACH   Final   Culture  Setup Time     Final   Value: 08/18/2013 01:10     Performed at [facility]   Culture     Final   Value:        BLOOD CULTURE RECEIVED NO GROWTH TO DATE CULTURE WILL BE HELD FOR 5 DAYS BEFORE ISSUING A FINAL NEGATIVE REPORT     Performed at [facility]   Report Status PENDING   Incomplete     Studies: No results found.  Scheduled Meds: . acyclovir ointment   Topical Q3H while awake  . aspirin  81 mg Oral Daily  . famotidine  20 mg Oral Daily  . fentaNYL  25 mcg Transdermal Q72H  . heparin  5,000 Units Subcutaneous 3 times per day  . metoprolol succinate  25 mg Oral  Daily  . piperacillin-tazobactam (ZOSYN)  IV  3.375 g Intravenous Q8H   Continuous Infusions: . sodium chloride 0.9 % 1,000 mL infusion 75 mL/hr at 08/17/13 1854    Principal Problem:   Pericolonic abscess Active Problems:   Leukocytosis   Acute on chronic renal failure   Congestive heart failure (CHF)   Degenerative joint disease (DJD) of hip   GERD (gastroesophageal reflux disease)   CAD (coronary artery disease)   Pericolonic abscess due to diverticulitis    Time spent: 25 minutes    Kela Millin MD [facility] Pager [phone]. If 7PM-7AM, please contact night-coverage at [email or web address], password [facility] 08/22/2013, 6:50 PM  LOS: 6 days

## 2013-08-23 ENCOUNTER — Inpatient Hospital Stay (HOSPITAL_COMMUNITY): Payer: Medicare Other

## 2013-08-23 LAB — CBC
HCT: 28.7 % — ABNORMAL LOW (ref 36.0–46.0)
Hemoglobin: 9.4 g/dL — ABNORMAL LOW (ref 12.0–15.0)
MCH: 31.5 pg (ref 26.0–34.0)
MCHC: 32.8 g/dL (ref 30.0–36.0)
MCV: 96.3 fL (ref 78.0–100.0)
PLATELETS: 195 10*3/uL (ref 150–400)
RBC: 2.98 MIL/uL — ABNORMAL LOW (ref 3.87–5.11)
RDW: 14.2 % (ref 11.5–15.5)
WBC: 7 10*3/uL (ref 4.0–10.5)

## 2013-08-23 LAB — BASIC METABOLIC PANEL
BUN: 14 mg/dL (ref 6–23)
CALCIUM: 8.3 mg/dL — AB (ref 8.4–10.5)
CHLORIDE: 114 meq/L — AB (ref 96–112)
CO2: 20 mEq/L (ref 19–32)
Creatinine, Ser: 1.36 mg/dL — ABNORMAL HIGH (ref 0.50–1.10)
GFR calc non Af Amer: 32 mL/min — ABNORMAL LOW (ref >90)
GFR, EST AFRICAN AMERICAN: 38 mL/min — AB (ref >90)
Glucose, Bld: 107 mg/dL — ABNORMAL HIGH (ref 70–99)
Potassium: 4.2 mEq/L (ref 3.7–5.3)
Sodium: 145 mEq/L (ref 137–147)

## 2013-08-23 MED ORDER — IOHEXOL 300 MG/ML  SOLN
25.0000 mL | INTRAMUSCULAR | Status: AC
  Administered 2013-08-23 (×2): 25 mL via ORAL

## 2013-08-23 NOTE — Progress Notes (Signed)
PT Cancellation Note  Patient Details Name: [name] MRN: [id number] DOB: Aug 20, 1917   Cancelled Treatment:    Reason Eval/Treat Not Completed: Fatigue/lethargy limiting ability to participate. Patient had just gotten back to bed and returned from CT. Patient was up with OT earlier this morning. Will follow up later as time allows   Robinette, Adline PotterJulia Elizabeth 08/23/2013, 1:26 PM

## 2013-08-23 NOTE — Progress Notes (Signed)
Subjective: Pt feels great.  No new complaints.  Tolerating solid food.  Anxious to have CT done.  Objective: Vital signs in last 24 hours: Temp:  [98 F (36.7 C)-98.5 F (36.9 C)] 98.5 F (36.9 C) (03/09 0544) Pulse Rate:  [60-95] 90 (03/09 0544) Resp:  [18-20] 20 (03/09 0544) BP: (116-156)/(61-84) 116/70 mmHg (03/09 0544) SpO2:  [95 %-99 %] 98 % (03/09 0544) Weight:  [167 lb 12.3 oz (76.1 kg)] 167 lb 12.3 oz (76.1 kg) (03/09 0544) Last BM Date: 08/21/13  Intake/Output from previous day: 03/08 0701 - 03/09 0700 In: 245 [P.O.:240] Out: 20 [Drains:20] Intake/Output this shift:    PE: Gen:  Alert, NAD, pleasant Abd: Soft, NT/ND, +BS, no HSM, incisions C/D/I, drain with serosanguinous drainage and a layer of cloudy purulent looking drainage in the JP bulb drain as well. 44mL/24 hours output   Lab Results:   Recent Labs  08/22/13 0650 08/23/13 0525  WBC 7.8 7.0  HGB 9.4* 9.4*  HCT 29.0* 28.7*  PLT 199 195   BMET  Recent Labs  08/22/13 0650 08/23/13 0525  NA 141 145  K 4.3 4.2  CL 110 114*  CO2 18* 20  GLUCOSE 105* 107*  BUN 15 14  CREATININE 1.43* 1.36*  CALCIUM 8.0* 8.3*   PT/INR No results found for this basename: LABPROT, INR,  in the last 72 hours CMP     Component Value Date/Time   NA 145 08/23/2013 0525   K 4.2 08/23/2013 0525   CL 114* 08/23/2013 0525   CO2 20 08/23/2013 0525   GLUCOSE 107* 08/23/2013 0525   BUN 14 08/23/2013 0525   CREATININE 1.36* 08/23/2013 0525   CALCIUM 8.3* 08/23/2013 0525   PROT 5.9* 08/16/2013 1829   ALBUMIN 2.6* 08/16/2013 1829   AST 17 08/16/2013 1829   ALT 31 08/16/2013 1829   ALKPHOS 98 08/16/2013 1829   BILITOT 0.7 08/16/2013 1829   GFRNONAA 32* 08/23/2013 0525   GFRAA 38* 08/23/2013 0525   Lipase     Component Value Date/Time   LIPASE 23 04/17/2010 0535       Studies/Results: No results found.  Anti-infectives: Anti-infectives   Start     Dose/Rate Route Frequency Ordered Stop   08/20/13 1200  piperacillin-tazobactam  (ZOSYN) IVPB 3.375 g     3.375 g 12.5 mL/hr over 240 Minutes Intravenous Every 8 hours 08/20/13 1103     08/18/13 1030  piperacillin-tazobactam (ZOSYN) IVPB 2.25 g  Status:  Discontinued     2.25 g 100 mL/hr over 30 Minutes Intravenous 3 times per day 08/18/13 0945 08/20/13 1102   08/17/13 1915  ciprofloxacin (CIPRO) IVPB 400 mg  Status:  Discontinued     400 mg 200 mL/hr over 60 Minutes Intravenous Every 24 hours 08/16/13 1940 08/18/13 0933   08/16/13 1945  ciprofloxacin (CIPRO) IVPB 400 mg  Status:  Discontinued     400 mg 200 mL/hr over 60 Minutes Intravenous STAT 08/16/13 1941 08/16/13 2039   08/16/13 1915  ciprofloxacin (CIPRO) IVPB 400 mg  Status:  Discontinued     400 mg 200 mL/hr over 60 Minutes Intravenous Every 12 hours 08/16/13 1910 08/16/13 1940   08/16/13 1915  metroNIDAZOLE (FLAGYL) IVPB 500 mg  Status:  Discontinued     500 mg 100 mL/hr over 60 Minutes Intravenous Every 8 hours 08/16/13 1910 08/18/13 0933   08/16/13 1830  Ampicillin-Sulbactam (UNASYN) 3 g in sodium chloride 0.9 % 100 mL IVPB  Status:  Discontinued  3 g 100 mL/hr over 60 Minutes Intravenous STAT 08/16/13 1829 08/16/13 1910       Assessment/Plan Diverticulitis with abscess/IR drain on 08/17/13  Hypertension  CAD/HX OF CHF/CABG x 2, Mitral valve repair with 28 mm Seguin annuloplasty ring, 2007  Renal Insuffiencey - improving 1.36 Hx of goiter  Spinal stenosis with back and leg pain  Cholecystectomy 2011 (DB)   Plan:  1. Continue antibiotics, On Zosyn Day #5, WBC normal at 7.0 today 2. IVF, pain control, antiemetics  3. Low fiber/soft diet this am  4. Not ambulatory at baseline due to severe arthritis, but PT/OT consult may be helpful  5. SCD's and heparin  6. IS  7. Drain had some noticeable purulent drainage this morning.  Recheck CT Monday pending, will need to go home with JP  Drain since still putting out purulent drainage, further recommendation after CT results are back.     LOS: 7 days     DORT, Manal Kreutzer 08/23/2013, 10:02 AM Pager: [phone]

## 2013-08-23 NOTE — Progress Notes (Signed)
ANTIBIOTIC CONSULT NOTE - FOLLOW UP  Pharmacy Consult for Zosyn Indication: Pelvic abscess  Allergies  Allergen Reactions  . Codeine     unknown  . Erythromycin     unknown    Patient Measurements: Height: 5\' 8"  (172.7 cm) Weight: 167 lb 12.3 oz (76.1 kg) IBW/kg (Calculated) : 63.9 Adjusted Body Weight:   Vital Signs: Temp: 98.5 F (36.9 C) (03/09 0544) Temp src: Oral (03/09 0544) BP: 116/70 mmHg (03/09 0544) Pulse Rate: 90 (03/09 0544) Intake/Output from previous day: 03/08 0701 - 03/09 0700 In: 245 [P.O.:240] Out: 20 [Drains:20] Intake/Output from this shift:    Labs:  Recent Labs  08/22/13 0650 08/23/13 0525  WBC 7.8 7.0  HGB 9.4* 9.4*  PLT 199 195  CREATININE 1.43* 1.36*   Estimated Creatinine Clearance: 26.1 ml/min (by C-G formula based on Cr of 1.36). No results found for this basename: VANCOTROUGH, VANCOPEAK, VANCORANDOM, GENTTROUGH, GENTPEAK, GENTRANDOM, TOBRATROUGH, TOBRAPEAK, TOBRARND, AMIKACINPEAK, AMIKACINTROU, AMIKACIN,  in the last 72 hours   Microbiology: Recent Results (from the past 720 hour(s))  CULTURE, ROUTINE-ABSCESS     Status: None   Collection Time    08/17/13  5:15 PM      Result Value Ref Range Status   Specimen Description ABSCESS   Final   Special Requests PELVIC ABSCESS   Final   Gram Stain     Final   Value: ABUNDANT WBC PRESENT,BOTH PMN AND MONONUCLEAR     NO SQUAMOUS EPITHELIAL CELLS SEEN     ABUNDANT GRAM POSITIVE COCCI IN PAIRS     ABUNDANT GRAM NEGATIVE RODS     FEW GRAM POSITIVE RODS   Culture     Final   Value: ABUNDANT KLEBSIELLA OXYTOCA     Performed at [facility]   Report Status 08/21/2013 FINAL   Final   Organism ID, Bacteria KLEBSIELLA OXYTOCA   Final  CULTURE, BLOOD (ROUTINE X 2)     Status: None   Collection Time    08/17/13  7:50 PM      Result Value Ref Range Status   Specimen Description BLOOD RIGHT ARM   Final   Special Requests BOTTLES DRAWN AEROBIC AND ANAEROBIC 10CC EACH   Final   Culture  Setup Time     Final   Value: 08/18/2013 01:11     Performed at [facility]   Culture     Final   Value:        BLOOD CULTURE RECEIVED NO GROWTH TO DATE CULTURE WILL BE HELD FOR 5 DAYS BEFORE ISSUING A FINAL NEGATIVE REPORT     Performed at [facility]   Report Status PENDING   Incomplete  CULTURE, BLOOD (ROUTINE X 2)     Status: None   Collection Time    08/17/13  8:00 PM      Result Value Ref Range Status   Specimen Description BLOOD RIGHT HAND   Final   Special Requests BOTTLES DRAWN AEROBIC AND ANAEROBIC 10CC EACH   Final   Culture  Setup Time     Final   Value: 08/18/2013 01:10     Performed at [facility]   Culture     Final   Value:        BLOOD CULTURE RECEIVED NO GROWTH TO DATE CULTURE WILL BE HELD FOR 5 DAYS BEFORE ISSUING A FINAL NEGATIVE REPORT     Performed at [facility]   Report Status PENDING   Incomplete  Anti-infectives   Start     Dose/Rate Route Frequency Ordered Stop   08/20/13 1200  piperacillin-tazobactam (ZOSYN) IVPB 3.375 g     3.375 g 12.5 mL/hr over 240 Minutes Intravenous Every 8 hours 08/20/13 1103     08/18/13 1030  piperacillin-tazobactam (ZOSYN) IVPB 2.25 g  Status:  Discontinued     2.25 g 100 mL/hr over 30 Minutes Intravenous 3 times per day 08/18/13 0945 08/20/13 1102   08/17/13 1915  ciprofloxacin (CIPRO) IVPB 400 mg  Status:  Discontinued     400 mg 200 mL/hr over 60 Minutes Intravenous Every 24 hours 08/16/13 1940 08/18/13 0933   08/16/13 1945  ciprofloxacin (CIPRO) IVPB 400 mg  Status:  Discontinued     400 mg 200 mL/hr over 60 Minutes Intravenous STAT 08/16/13 1941 08/16/13 2039   08/16/13 1915  ciprofloxacin (CIPRO) IVPB 400 mg  Status:  Discontinued     400 mg 200 mL/hr over 60 Minutes Intravenous Every 12 hours 08/16/13 1910 08/16/13 1940   08/16/13 1915  metroNIDAZOLE (FLAGYL) IVPB 500 mg  Status:  Discontinued     500 mg 100 mL/hr over 60 Minutes Intravenous Every 8 hours 08/16/13 1910  08/18/13 0933   08/16/13 1830  Ampicillin-Sulbactam (UNASYN) 3 g in sodium chloride 0.9 % 100 mL IVPB  Status:  Discontinued     3 g 100 mL/hr over 60 Minutes Intravenous STAT 08/16/13 1829 08/16/13 1910      Assessment: CC: abd pain, fever, intra-abd abscess 89 YOF started on Zosyn for worsening leukocytosis and temperature post percutaneous tube placement for intra-abdominal abscess drainage. Patient continues on Zosyn for GNR growing in pelvic abscess culture. Fever and leukocytosis resolved. She has a history of CKD and her renal function is improving and approaching her baseline.  Infectious Disease: Zosyn D#6 for GNR in intra-abd abscess. Afeb, WBC WNL  Cipro/Flagyl 3/2 >> 3/4 Zosyn 3/4 >>  3/3 Pelvic abscess - KLEBSIELLA OXYTOCA 3/3 Bld x2 - NGTD  Cardiovascular: HTN / CAD post CABG / dHF (EF 55%) - VSS - on ASA81, Toprol  Gastrointestinal / Nutrition: diverticulosis / GERD - LFTs WNL. CT showed air-fluid collection and perc drain placed 3/4, minimal O/P. Pepcid.LBM 3/7. Repeat CT on Mon and if improved d/c home. Tolerating solid food.  Nephrology: CKD III (SCr 1.5 ~4 mos ago) - SCr down 1.36, CrCL 26 ml/min, lytes ok.  Neuro: DJD of B/L hips - fentanyl patch for pain. Non-ambulatory.  Hematology / Oncology: anemia with hgb 9.4 (stable)  PTA Medication: enalapril, Lasix, Dyazide  Best Practices: heparin SQ   Goal of Therapy:  Clearance of infection / Infection prevention   Plan:  - Zosyn 3.375gm IV Q8H, 4 hr infusion - CT scan today   [name], PharmD, BCPS Clinical Staff Pharmacist Pager [phone]  [name], [name] 08/23/2013,12:42 PM

## 2013-08-23 NOTE — Progress Notes (Signed)
I have seen and examined the patient and agree with the assessment and plans.  Will check CT   Ariel David A. Magnus IvanBlackman  MD, FACS

## 2013-08-23 NOTE — Evaluation (Signed)
Occupational Therapy Evaluation Patient Details Name: [name] MRN: [id number] DOB: [birth year] Today's Date: 08/23/2013 Time: 4098-1191 OT Time Calculation (min): 39 min  OT Assessment / Plan / Recommendation History of present illness [name] is a 80 y.o. female with PMH significant for HTN, diverticulosis, DJD (affecting hip and mainly wheelchair bound), CAD (S/P CABG), diastolic heart failure (last EF 55%); CKD (stage 3, Cr baseline 1.54) and GERD; came to ED from PCP office due to approx 1.5 weeks complaints of abd pain (LLQ, intermittent, crampy and with associated fever). Patient had CT scan at PCP office that demonstrated air-fluid levels in colonic area and suggested pericolonic abscess; also with acute on chronic renal failure (Cr now in the 1.9 range) and elevated WBC's ( 20,000).    Clinical Impression   Pt. Was cooperative with therapy and has decreased function from baseline. Pt. Is agreeable to continuing to work with OT to increase performance and safety with ADLs and mobility.     OT Assessment  Patient needs continued OT Services    Follow Up Recommendations  Home health OT    Barriers to Discharge Decreased caregiver support    Equipment Recommendations  None recommended by OT    Recommendations for Other Services    Frequency  Min 2X/week    Precautions / Restrictions Precautions Precautions: Fall Restrictions Weight Bearing Restrictions: No   Pertinent Vitals/Pain No c/o    ADL  Eating/Feeding: Performed;Independent Where Assessed - Eating/Feeding: Bed level Grooming: Performed;Wash/dry hands;Wash/dry face;Set up Where Assessed - Grooming: Unsupported sitting Upper Body Bathing: Simulated;Set up Where Assessed - Upper Body Bathing: Unsupported sitting Lower Body Bathing: Simulated;Moderate assistance Where Assessed - Lower Body Bathing: Unsupported sitting;Supported standing Upper Body Dressing: Performed;Minimal assistance Where Assessed -  Upper Body Dressing: Unsupported sitting Lower Body Dressing: Performed;Maximal assistance Where Assessed - Lower Body Dressing: Unsupported sitting;Supported standing Toilet Transfer: Performed;Minimal assistance;Moderate assistance Toilet Transfer Method: Stand pivot Acupuncturist: Materials engineer and Hygiene: Performed;Maximal assistance Where Assessed - Glass blower/designer Manipulation and Hygiene: Standing ADL Comments:  (Pt. is motivated to increase performance with ADLs and retur)    OT Diagnosis: Generalized weakness  OT Problem List: Decreased activity tolerance;Decreased knowledge of use of DME or AE OT Treatment Interventions: Self-care/ADL training;DME and/or AE instruction;Therapeutic activities   OT Goals(Current goals can be found in the care plan section) Acute Rehab OT Goals Patient Stated Goal: To go home soon OT Goal Formulation: With patient Time For Goal Achievement: 09/06/13 Potential to Achieve Goals: Good ADL Goals Pt Will Perform Grooming: with modified independence;standing Pt Will Perform Lower Body Bathing: with supervision;sit to/from stand Pt Will Perform Lower Body Dressing: with set-up;sit to/from stand Pt Will Transfer to Toilet: with modified independence;bedside commode Pt Will Perform Toileting - Clothing Manipulation and hygiene: with modified independence;sit to/from stand  Visit Information  Last OT Received On: 08/23/13 Assistance Needed: +1 History of Present Illness: [name] is a 80 y.o. female with PMH significant for HTN, diverticulosis, DJD (affecting hip and mainly wheelchair bound), CAD (S/P CABG), diastolic heart failure (last EF 55%); CKD (stage 3, Cr baseline 1.54) and GERD; came to ED from PCP office due to approx 1.5 weeks complaints of abd pain (LLQ, intermittent, crampy and with associated fever). Patient had CT scan at PCP office that demonstrated air-fluid levels in colonic area  and suggested pericolonic abscess; also with acute on chronic renal failure (Cr now in the 1.9 range) and elevated WBC's ( 20,000).  Prior Functioning     Home Living Family/patient expects to be discharged to:: Private residence Living Arrangements: Children Available Help at Discharge: Family;Available PRN/intermittently Type of Home: House Home Access: Ramped entrance Home Layout: One level Home Equipment: Wheelchair - Fluor Corporationmanual;Walker - 2 wheels;Bedside commode;Shower seat Additional Comments:  (Pt. has grab bars in shower and next to toilet) Prior Function Gait / Transfers Assistance Needed: Patient uses w/c during day.  Can transfer independently to/from w/c.  Does stand with RW to "stretch my legs" per patient - does this with someone with her. ADL's / Homemaking Assistance Needed: Pt. familty A with bathing and home management tasks. Comments: Family stays with her evenings and nights. Communication Communication: HOH         Vision/Perception Vision - History Baseline Vision: Wears glasses all the time Visual History:  (has prosthetic L eye) Patient Visual Report: No change from baseline   Cognition  Cognition Arousal/Alertness: Awake/alert Behavior During Therapy: WFL for tasks assessed/performed Overall Cognitive Status: Within Functional Limits for tasks assessed    Extremity/Trunk Assessment Upper Extremity Assessment Upper Extremity Assessment: RUE deficits/detail RUE Deficits / Details: Decreased shoulder ROM and strength - h/o rotator cuff surgery.   LUE Deficits / Details: Limited shoulder strength due to pain with movement. LUE:  (Pt. had sx but limited to 80 degrees flex)     Mobility Bed Mobility Bed Mobility: Supine to Sit Supine to sit: Min assist Transfers Overall transfer level: Needs assistance Equipment used: Rolling walker (2 wheeled) Transfers: Sit to/from Stand Sit to Stand: Min assist;Mod assist Stand pivot transfers: Min  assist;Mod assist General transfer comment: Verbal cues for hand placement. Assist for safety/balance.     Exercise     Balance     End of Session OT - End of Session Equipment Utilized During Treatment: Rolling walker Activity Tolerance: Patient tolerated treatment well Patient left: in bed;with call bell/phone within reach;with family/visitor present  GO     Irby Fails 08/23/2013, 8:32 AM

## 2013-08-23 NOTE — Progress Notes (Signed)
[facility] PROGRESS NOTE  [name] ZOX:[id number] DOB: 10-28-1917 DOA: 08/16/2013 PCP: Lindwood Qua, MD  Assessment/Plan: #1SIRS/early sepsis secondary to pericolonic abscess and diverticulitis -Patient hypotensive overnight and WBC up to 20.8 on 3/5, improved to 12 today 3/6>> 7.8, resolved today 3/6 -BP stable, continue Zosyn -Cultures to date with no growth, follow -Patient advanced to solids,follow -Per Surgery  Repeat CT today 3/9  With near complete resolution of abscess>> discussed with Dr Magnus Ivan recommends to d/c in am with drain in- follow up outpt -PT recommending HH services #2 leukocytosis Secondary to problem #1. As discussed above  resolved   #3 acute on chronic kidney disease stage III Baseline creatinine approximately 1.5. Enalapril, Lasix, HCTZ on hold.  -Follow and recheck in a.m.  #4 diastolic congestive heart failure/coronary artery disease Stable. Compensated. Continue to hold diuretics. Continue beta blocker and aspirin. -Monitor fluid status closely with fluids for to #1 as above #5 DJD of the hip Will resume fentanyl patch. Pain management.  #6 gastroesophageal reflux disease Continue Pepcid.  #7 prophylaxis Heparin for DVT prophylaxis.  Code Status: Full Family Communication: updated the patient, daughter and granddaughters at bedside. Disposition Plan: home when medically stable.   Consultants:  General surgery: Dr. Derrell Lolling 08/16/2013  Procedures:  Drain placement pending  CT of the abdomen and pelvis 08/16/2013  CT abd pelvis 3/9  Antibiotics:  IV ciprofloxacin/flagyl 08/16/2013>> 3/4  Zosyn started 3/4  HPI/Subjective: Doing well, Denies any complaints  Objective: Filed Vitals:   08/23/13 1351  BP: 139/92  Pulse: 93  Temp: 97.9 F (36.6 C)  Resp: 20    Intake/Output Summary (Last 24 hours) at 08/23/13 1621 Last data filed at 08/23/13 1215  Gross per 24 hour  Intake      0 ml  Output     25 ml  Net     -25 ml   Filed Weights   08/21/13 0529 08/22/13 0548 08/23/13 0544  Weight: 74.4 kg (164 lb 0.4 oz) 76.5 kg (168 lb 10.4 oz) 76.1 kg (167 lb 12.3 oz)    Exam:   General:  NAD  Cardiovascular: RRR  Respiratory: Decreased breath sounds at the bases, no crackles  Abdomen: soft,tNT, drain present to the left, positive bowel sounds, nondistended, no rebound, no guarding.  Musculoskeletal: no clubbing cyanosis or edema  Data Reviewed: Basic Metabolic Panel:  Recent Labs Lab 08/16/13 2227  08/18/13 0355 08/19/13 0605 08/20/13 0655 08/22/13 0650 08/23/13 0525  NA  --   < > 146 146 143 141 145  K  --   < > 4.3 4.6 4.2 4.3 4.2  CL  --   < > 111 111 111 110 114*  CO2  --   < > 18* 18* 19 18* 20  GLUCOSE  --   < > 121* 83 86 105* 107*  BUN  --   < > 34* 34* 27* 15 14  CREATININE  --   < > 1.80* 1.73* 1.60* 1.43* 1.36*  CALCIUM  --   < > 7.9* 8.2* 8.0* 8.0* 8.3*  MG 2.4  --   --   --   --   --   --   PHOS 3.2  --   --   --   --   --   --   < > = values in this interval not displayed. Liver Function Tests:  Recent Labs Lab 08/16/13 1829  AST 17  ALT 31  ALKPHOS 98  BILITOT 0.7  PROT 5.9*  ALBUMIN 2.6*   No results found for this basename: LIPASE, AMYLASE,  in the last 168 hours No results found for this basename: AMMONIA,  in the last 168 hours CBC:  Recent Labs Lab 08/16/13 1829  08/18/13 0355 08/19/13 0605 08/20/13 0655 08/22/13 0650 08/23/13 0525  WBC 20.2*  < > 20.8* 12.3* 7.8 7.8 7.0  NEUTROABS 18.3*  --  19.4*  --   --   --   --   HGB 10.4*  < > 9.4* 9.1* 9.1* 9.4* 9.4*  HCT 31.6*  < > 28.4* 28.4* 28.0* 29.0* 28.7*  MCV 97.2  < > 97.3 97.3 96.9 97.3 96.3  PLT 197  < > 138* 165 162 199 195  < > = values in this interval not displayed. Cardiac Enzymes: No results found for this basename: CKTOTAL, CKMB, CKMBINDEX, TROPONINI,  in the last 168 hours BNP (last 3 results) No results found for this basename: PROBNP,  in the last 8760 hours CBG: No results  found for this basename: GLUCAP,  in the last 168 hours  Recent Results (from the past 240 hour(s))  CULTURE, ROUTINE-ABSCESS     Status: None   Collection Time    08/17/13  5:15 PM      Result Value Ref Range Status   Specimen Description ABSCESS   Final   Special Requests PELVIC ABSCESS   Final   Gram Stain     Final   Value: ABUNDANT WBC PRESENT,BOTH PMN AND MONONUCLEAR     NO SQUAMOUS EPITHELIAL CELLS SEEN     ABUNDANT GRAM POSITIVE COCCI IN PAIRS     ABUNDANT GRAM NEGATIVE RODS     FEW GRAM POSITIVE RODS   Culture     Final   Value: ABUNDANT KLEBSIELLA OXYTOCA     Performed at [facility]   Report Status 08/21/2013 FINAL   Final   Organism ID, Bacteria KLEBSIELLA OXYTOCA   Final  CULTURE, BLOOD (ROUTINE X 2)     Status: None   Collection Time    08/17/13  7:50 PM      Result Value Ref Range Status   Specimen Description BLOOD RIGHT ARM   Final   Special Requests BOTTLES DRAWN AEROBIC AND ANAEROBIC 10CC EACH   Final   Culture  Setup Time     Final   Value: 08/18/2013 01:11     Performed at [facility]   Culture     Final   Value:        BLOOD CULTURE RECEIVED NO GROWTH TO DATE CULTURE WILL BE HELD FOR 5 DAYS BEFORE ISSUING A FINAL NEGATIVE REPORT     Performed at [facility]   Report Status PENDING   Incomplete  CULTURE, BLOOD (ROUTINE X 2)     Status: None   Collection Time    08/17/13  8:00 PM      Result Value Ref Range Status   Specimen Description BLOOD RIGHT HAND   Final   Special Requests BOTTLES DRAWN AEROBIC AND ANAEROBIC 10CC EACH   Final   Culture  Setup Time     Final   Value: 08/18/2013 01:10     Performed at [facility]   Culture     Final   Value:        BLOOD CULTURE RECEIVED NO GROWTH TO DATE CULTURE WILL BE HELD FOR 5 DAYS BEFORE ISSUING A FINAL NEGATIVE REPORT     Performed at [facility]  Report Status PENDING   Incomplete     Studies: Ct Abdomen Pelvis Wo Contrast  08/23/2013   CLINICAL DATA:  Post  drainage placement for diverticular abscess. Hypotension. Elevated white blood cell count.  EXAM: CT ABDOMEN AND PELVIS WITHOUT CONTRAST  TECHNIQUE: Multidetector CT imaging of the abdomen and pelvis was performed following the standard protocol without intravenous contrast.  COMPARISON:  CT IMAGE GUIDED DRAINAGE BY PERCUTANEOUS CATHETER dated 08/17/2013; CT ABD/PELV WO CM dated 08/16/2013  FINDINGS: Interval development of moderate-sized bilateral pleural effusions with associated compressive atelectasis.  Cholecystectomy clips are noted. 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 No focal hepatic abnormality. Bilateral nonobstructing renal calculi are noted, largest 2 mm at the right upper renal pole image 25. 1.9 cm right mid renal cortical cyst image 29. Unenhanced adrenal glands, pancreas, and spleen are unremarkable.  Left lower quadrant anterior approach percutaneous drainage catheter is in place with its tip at the previously seen pericolonic abscess. There is minimal soft tissue density around the drainage catheter with the collection now measuring at most 4.7 x 2.5 cm image 59. Right ovary is unremarkable. Left ovary not visualized but no adnexal mass is seen. Uterus presumed surgically absent. Left total hip arthroplasty obscures detail on the lower pelvis due streak artifact. Right femoral pins partly visualized. No new area of bowel wall thickening or focal segmental dilatation. No free air.  Multilevel disc degenerative change is noted with rightward curvature centered at L2. Extensive right hip degenerative change noted. No new acute osseous abnormality. T11 compression deformity reidentified.  IMPRESSION: Near complete resolution of previously seen left lower quadrant pericolonic presumed diverticular abscess, with drain remaining in place.  New moderate-sized bilateral pleural effusions with associated compressive atelectasis.  No new intra-abdominal or pelvic acute pathology.    Electronically Signed   By: Christiana PellantGretchen  Green M.D.   On: 08/23/2013 12:32    Scheduled Meds: . acyclovir ointment   Topical Q3H while awake  . aspirin  81 mg Oral Daily  . famotidine  20 mg Oral Daily  . fentaNYL  25 mcg Transdermal Q72H  . heparin  5,000 Units Subcutaneous 3 times per day  . metoprolol succinate  25 mg Oral Daily  . piperacillin-tazobactam (ZOSYN)  IV  3.375 g Intravenous Q8H   Continuous Infusions: . sodium chloride 0.9 % 1,000 mL infusion 75 mL/hr at 08/17/13 1854    Principal Problem:   Pericolonic abscess Active Problems:   Leukocytosis   Acute on chronic renal failure   Congestive heart failure (CHF)   Degenerative joint disease (DJD) of hip   GERD (gastroesophageal reflux disease)   CAD (coronary artery disease)   Pericolonic abscess due to diverticulitis    Time spent: 25 minutes    Kela MillinVIYUOH,Zyeir Dymek C MD [facility] Pager [phone]. If 7PM-7AM, please contact night-coverage at [email or web address], password [email or web address] 08/23/2013, 4:21 PM  LOS: 7 days

## 2013-08-24 LAB — CULTURE, BLOOD (ROUTINE X 2)
CULTURE: NO GROWTH
Culture: NO GROWTH

## 2013-08-24 MED ORDER — CIPROFLOXACIN HCL 500 MG PO TABS: 500.0000 mg | ORAL_TABLET | Freq: Two times a day (BID) | ORAL | Status: DC

## 2013-08-24 MED ORDER — TRAMADOL HCL 50 MG PO TABS: 50.0000 mg | ORAL_TABLET | Freq: Three times a day (TID) | ORAL | Status: AC | PRN

## 2013-08-24 MED ORDER — METRONIDAZOLE 500 MG PO TABS: 500.0000 mg | ORAL_TABLET | Freq: Three times a day (TID) | ORAL | Status: DC

## 2013-08-24 NOTE — Progress Notes (Signed)
I have seen and examined the patient and agree with the assessment and plans.  Takari Duncombe A. Jadrian Bulman  MD, FACS  

## 2013-08-24 NOTE — Care Management Note (Signed)
  Page 1 of 1   08/24/2013     1:05:59 PM   CARE MANAGEMENT NOTE 08/24/2013  Patient:  [name],[name]   Account Number:  [id number]  Date Initiated:  08/24/2013  Documentation initiated by:  Ronny FlurryWILE,Fionn Stracke  Subjective/Objective Assessment:     Action/Plan:   Anticipated DC Date:  08/24/2013   Anticipated DC Plan:  [name]         Choice offered to / List presented to:  C-4 Adult Children        HH arranged  HH-1 RN  HH-2 PT  HH-3 OT      [facility]   Status of service:  Completed, signed off Medicare Important Message given?   (If response is "NO", the following Medicare IM given date fields will be blank) Date Medicare IM given:   Date Additional Medicare IM given:    Discharge Disposition:    Per UR Regulation:    If discussed at Long Length of Stay Meetings, dates discussed:    Comments:  08-24-13 Spoke with patient , son and daughter at bedside. Confirmed face face information.  Daughter Cherylann RatelJanis [phone].  Spoke with Clydie BraunKaren at [facility] and faxed information [phone]  Ronny FlurryHeather Devere Brem RN BSN [phone]

## 2013-08-24 NOTE — Discharge Summary (Signed)
Physician Discharge Summary  [name]:[id number] DOB: 10-20-1917 DOA: 08/16/2013  PCP: Lindwood Qua, MD  Admit date: 08/16/2013 Discharge date: 08/24/2013  Time spent: >30 minutes  Recommendations for Outpatient Follow-up: Follow-up Information   Follow up with [facility], MD. (in 1week, call for appointment upon discharge)    Specialty:  Internal Medicine   Contact information:   [address] [facility] [place] [address] [phone]       Follow up with Mariella Saa, MD. (in 2weeks, as directed, call for appointment discharge.)    Specialty:  General Surgery   Contact information:   [address] [place] [address] [phone]        Discharge Diagnoses:  Principal Problem:   Pericolonic abscess Active Problems:   Leukocytosis   Acute on chronic renal failure   Congestive heart failure (CHF)   Degenerative joint disease (DJD) of hip   GERD (gastroesophageal reflux disease)   CAD (coronary artery disease)   Pericolonic abscess due to diverticulitis   Discharge Condition: Improved/stable  Diet recommendation: Low sodium heart healthy  Filed Weights   08/22/13 0548 08/23/13 0544 08/24/13 0504  Weight: 76.5 kg (168 lb 10.4 oz) 76.1 kg (167 lb 12.3 oz) 76.6 kg (168 lb 14 oz)    History of present illness:  [name] is a 80 y.o. female with PMH significant for HTN, diverticulosis, DJD (affecting hip and mainly wheelchair bound), CAD (S/P CABG), diastolic heart failure (last EF 55%); CKD (stage 3, Cr baseline 1.54) and GERD; came to ED from PCP office due to approx 1.5 weeks complaints of abd pain (LLQ, intermittent, crampy and with associated fever). Patient had CT scan at PCP office that demonstrated air-fluid levels in colonic area and suggested pericolonic abscess; also with acute on chronic renal failure (Cr now in the 1.9 range) and elevated WBC's ( 20,000). Patient referred for admission for  further evaluation and treatment.  She denies N/V, fever, chills, melena, hematochezia, CP, SOB or any other complaints.   Hospital Course:  #1SIRS/early sepsis secondary to pericolonic abscess and diverticulitis  -As discussed above, upon admission patient was placed on empiric antibiotics IV, initially with Cipro and Flagyl and surgery was consulted and followed patient. And followup in the hospital Patient hypotensive overnight and WBC up to 20.8 on 3/5 and antibiotics were changed to Zosyn. IR was consulted to surgery and a drain was placed (per IR). on followup the next day 3/6, after antibiotic change leukocytosis in improved, and hypotension had resolved with IV fluids. -abscess cultures grew Klebsiella oxytoca, blood Cultures to date with no growth, follow  -Surgery followed and Patient's diet was advanced to solids which has been tolerating well. -She has continued to improve clinically -Repeat CT was done 3/9 With near complete resolution of abscess>> discussed with Dr Magnus Ivan recommends to d/c in am with drain as purulent drainage was still noted to in bulb. She is to follow up outpatient with surgery -She'll be discharged on oral Cipro and Flagyl for the next 2 weeks. -PT recommending HH services  #2 leukocytosis  Secondary to problem #1. As discussed above resolved  #3 acute on chronic kidney disease stage III  Her Enalapril, Lasix, HCTZ on hold way held in the hospital and her renal function has improved to better than baseline -She is to resume her outpatient medications upon discharge and followup with her PCP. #4 diastolic congestive heart failure/coronary artery disease  Stable. Compensated. Continue to hold  diuretics. Continue beta blocker and aspirin.  -Her fluid status was monitored in the hospital, she is to resume her outpatient medications upon discharge. #5 DJD of the hip  -She was maintained on her outpatient medications on the hospital. #6 gastroesophageal reflux  disease  Continue Pepcid.    Consultants:  General surgery: Dr. Derrell Lolling 08/16/2013 Procedures:  Drain placement  per IR  CT of the abdomen and pelvis 08/16/2013  CT abd pelvis 3/9  Discharge Exam: Filed Vitals:   08/24/13 0504  BP: 115/55  Pulse: 98  Temp: 98.4 F (36.9 C)  Resp: 18   Exam:  General: NAD  Cardiovascular: RRR  Respiratory: Decreased breath sounds at the bases, no crackles  Abdomen: soft,tNT, drain present to the left, positive bowel sounds, nondistended, no rebound, no guarding.  Musculoskeletal: no clubbing cyanosis or edema   Discharge Instructions  Discharge Orders   Future Appointments Provider Department Dept Phone   09/08/2013 4:15 PM Mariella Saa, MD [facility] [place] [phone]   Future Orders Complete By Expires   Diet - low sodium heart healthy  As directed    Increase activity slowly  As directed        Medication List         acetaminophen 500 MG tablet  Commonly known as:  TYLENOL  Take 500 mg by mouth every 6 (six) hours as needed for mild pain.     aspirin 81 MG tablet  Take 81 mg by mouth daily.     ciprofloxacin 500 MG tablet  Commonly known as:  CIPRO  Take 1 tablet (500 mg total) by mouth 2 (two) times daily.     enalapril 10 MG tablet  Commonly known as:  VASOTEC  Take 10 mg by mouth daily.     famotidine 20 MG tablet  Commonly known as:  PEPCID  Take 20 mg by mouth 2 (two) times daily.     fentaNYL 25 MCG/HR patch  Commonly known as:  DURAGESIC - dosed mcg/hr  Place 25 mcg onto the skin every 3 (three) days.     fluticasone 27.5 MCG/SPRAY nasal spray  Commonly known as:  VERAMYST  Place 2 sprays into the nose daily as needed for rhinitis.     furosemide 40 MG tablet  Commonly known as:  LASIX  Take 40-80 mg by mouth daily as needed for fluid. Take one tablet if fluid is ok.. And take two tablets by mouth if fluid gets bad     metoprolol succinate 50 MG 24 hr tablet  Commonly known as:   TOPROL-XL  Take 25-50 mg by mouth daily. Take with or immediately following a meal.     metroNIDAZOLE 500 MG tablet  Commonly known as:  FLAGYL  Take 1 tablet (500 mg total) by mouth 3 (three) times daily.     traMADol 50 MG tablet  Commonly known as:  ULTRAM  Take 1 tablet (50 mg total) by mouth every 8 (eight) hours as needed for moderate pain.     triamterene-hydrochlorothiazide 37.5-25 MG per capsule  Commonly known as:  DYAZIDE  Take 1 capsule by mouth daily.       Allergies  Allergen Reactions  . Codeine     unknown  . Erythromycin     unknown       Follow-up Information   Follow up with [facility], MD. (in 1week, call for appointment upon discharge)    Specialty:  Internal Medicine   Contact information:  [address] [facility] [place] [address] [phone]       Follow up with Mariella Saa, MD. (in 2weeks, as directed, call for appointment discharge.)    Specialty:  General Surgery   Contact information:   [address] [place] [address] [phone]        The results of significant diagnostics from this hospitalization (including imaging, microbiology, ancillary and laboratory) are listed below for reference.    Significant Diagnostic Studies: Ct Abdomen Pelvis Wo Contrast  08/23/2013   CLINICAL DATA:  Post drainage placement for diverticular abscess. Hypotension. Elevated white blood cell count.  EXAM: CT ABDOMEN AND PELVIS WITHOUT CONTRAST  TECHNIQUE: Multidetector CT imaging of the abdomen and pelvis was performed following the standard protocol without intravenous contrast.  COMPARISON:  CT IMAGE GUIDED DRAINAGE BY PERCUTANEOUS CATHETER dated 08/17/2013; CT ABD/PELV WO CM dated 08/16/2013  FINDINGS: Interval development of moderate-sized bilateral pleural effusions with associated compressive atelectasis.  Cholecystectomy clips are noted. Left hepatic lobe atrophy or congenital hypoplasia  reidentified. No focal hepatic abnormality. Bilateral nonobstructing renal calculi are noted, largest 2 mm at the right upper renal pole image 25. 1.9 cm right mid renal cortical cyst image 29. Unenhanced adrenal glands, pancreas, and spleen are unremarkable.  Left lower quadrant anterior approach percutaneous drainage catheter is in place with its tip at the previously seen pericolonic abscess. There is minimal soft tissue density around the drainage catheter with the collection now measuring at most 4.7 x 2.5 cm image 59. Right ovary is unremarkable. Left ovary not visualized but no adnexal mass is seen. Uterus presumed surgically absent. Left total hip arthroplasty obscures detail on the lower pelvis due streak artifact. Right femoral pins partly visualized. No new area of bowel wall thickening or focal segmental dilatation. No free air.  Multilevel disc degenerative change is noted with rightward curvature centered at L2. Extensive right hip degenerative change noted. No new acute osseous abnormality. T11 compression deformity reidentified.  IMPRESSION: Near complete resolution of previously seen left lower quadrant pericolonic presumed diverticular abscess, with drain remaining in place.  New moderate-sized bilateral pleural effusions with associated compressive atelectasis.  No new intra-abdominal or pelvic acute pathology.   Electronically Signed   By: Christiana Pellant M.D.   On: 08/23/2013 12:32   Ct Abdomen Pelvis Wo Contrast  08/17/2013   CLINICAL DATA:  Abdominal pain. Diverticulitis. Intra-abdominal abscess.  EXAM: CT ABDOMEN AND PELVIS WITHOUT CONTRAST  TECHNIQUE: Multidetector CT imaging of the abdomen and pelvis was performed following the standard protocol without intravenous contrast.  COMPARISON:  08/16/2013 and 11/23/2005.  FINDINGS: Left pelvic 8.4 x 4.7 cm air-fluid collection consistent with an abscess which is immediately adjacent to the sigmoid colon which contain diverticula and therefore  abscess is most likely related to diverticulitis. Adnexa not seen separate from this abscess.  No free intraperitoneal air separate from the abscess.  Small hiatal hernia.  Post left hip replacement causing significant streak artifact.  Prior pinning left hip with pins extending through the femoral head. Marked right hip joint degenerative changes.  Scoliosis and degenerative changes with various degrees of spinal stenosis.  Nonobstructing renal calculi.  1.8 cm right renal lesion cannot be confirmed as a cyst. This is not identified on the 2007 exam. Ultrasound can be obtained for further delineation.  Atherosclerotic type changes of the aorta and iliac arteries with ectasia. Question 9 mm left splenic artery aneurysm.  Post cholecystectomy.  Taking into account  limitation by non contrast imaging, no worrisome hepatic, splenic, pancreatic, adrenal or left renal lesion.  IMPRESSION: Left pelvic 8.4 x 4.7 cm air-fluid collection consistent with an abscess which is immediately adjacent to the sigmoid colon which contain diverticula and therefore abscess is most likely related to diverticulitis. Adnexa not seen separate from this abscess.  Prior pinning left hip with pins extending through the femoral head. Marked right hip joint degenerative changes.  Scoliosis and degenerative changes with various degrees of spinal stenosis.  1.8 cm right renal lesion cannot be confirmed as a cyst. This is not identified on the 2007 exam. Ultrasound can be obtained for further delineation.  Atherosclerotic type changes of the aorta and iliac arteries with ectasia. Question 9 mm left splenic artery aneurysm.   Electronically Signed   By: Bridgett LarssonSteve  Olson M.D.   On: 08/17/2013 01:08   Ct Image Guided Drainage By Percutaneous Catheter  08/17/2013   CLINICAL DATA:  80 year old with a left pelvic abscess.  EXAM: CT-GUIDED DRAINAGE OF LEFT PELVIC ABSCESS COLLECTION  Physician: Rachelle HoraAdam R. Lowella DandyHenn, MD  MEDICATIONS: 1.5 mg versed. Fentanyl 37.5 mcg.  A radiology nurse monitored the patient for moderate sedation.  ANESTHESIA/SEDATION: Moderate sedation time: 30 min  PROCEDURE: Informed consent was obtained for a CT-guided drain placement. The patient was placed with the left side elevated. Images through the pelvis were obtained. The left lower abdomen was prepped and draped in a sterile fashion. The skin was anesthetized with 1% lidocaine. An 18 gauge needle was directed from the left iliac wing towards the left pelvic fluid collection. Needle was directed between the left psoas tendon and left gonadal vessels. Clay colored, foul-smelling fluid was aspirated. A stiff Amplatz wire was placed. The tract was dilated and a 10 [place] drain was placed. Approximately 30 mL of fluid was removed. Catheter was sutured to the skin. Fluid sample was sent for Gram stain and culture.  COMPLICATIONS: None  FINDINGS: Air-fluid collection in the left hemipelvis adjacent to the sigmoid colon and the left gonadal vessels. The collection is consistent with an abscess.  IMPRESSION: Successful CT-guided placement of a percutaneous drain within the left pelvic abscess.   Electronically Signed   By: Richarda OverlieAdam  Henn M.D.   On: 08/17/2013 18:08    Microbiology: Recent Results (from the past 240 hour(s))  CULTURE, ROUTINE-ABSCESS     Status: None   Collection Time    08/17/13  5:15 PM      Result Value Ref Range Status   Specimen Description ABSCESS   Final   Special Requests PELVIC ABSCESS   Final   Gram Stain     Final   Value: ABUNDANT WBC PRESENT,BOTH PMN AND MONONUCLEAR     NO SQUAMOUS EPITHELIAL CELLS SEEN     ABUNDANT GRAM POSITIVE COCCI IN PAIRS     ABUNDANT GRAM NEGATIVE RODS     FEW GRAM POSITIVE RODS   Culture     Final   Value: ABUNDANT KLEBSIELLA OXYTOCA     Performed at [facility]   Report Status 08/21/2013 FINAL   Final   Organism ID, Bacteria KLEBSIELLA OXYTOCA   Final  CULTURE, BLOOD (ROUTINE X 2)     Status: None   Collection Time    08/17/13   7:50 PM      Result Value Ref Range Status   Specimen Description BLOOD RIGHT ARM   Final   Special Requests BOTTLES DRAWN AEROBIC AND ANAEROBIC 10CC EACH   Final   Culture  Setup Time     Final   Value: 08/18/2013 01:11     Performed at [facility]   Culture     Final   Value: NO GROWTH 5 DAYS     Performed at [facility]   Report Status 08/24/2013 FINAL   Final  CULTURE, BLOOD (ROUTINE X 2)     Status: None   Collection Time    08/17/13  8:00 PM      Result Value Ref Range Status   Specimen Description BLOOD RIGHT HAND   Final   Special Requests BOTTLES DRAWN AEROBIC AND ANAEROBIC 10CC EACH   Final   Culture  Setup Time     Final   Value: 08/18/2013 01:10     Performed at [facility]   Culture     Final   Value: NO GROWTH 5 DAYS     Performed at [facility]   Report Status 08/24/2013 FINAL   Final     Labs: Basic Metabolic Panel:  Recent Labs Lab 08/18/13 0355 08/19/13 0605 08/20/13 0655 08/22/13 0650 08/23/13 0525  NA 146 146 143 141 145  K 4.3 4.6 4.2 4.3 4.2  CL 111 111 111 110 114*  CO2 18* 18* 19 18* 20  GLUCOSE 121* 83 86 105* 107*  BUN 34* 34* 27* 15 14  CREATININE 1.80* 1.73* 1.60* 1.43* 1.36*  CALCIUM 7.9* 8.2* 8.0* 8.0* 8.3*   Liver Function Tests: No results found for this basename: AST, ALT, ALKPHOS, BILITOT, PROT, ALBUMIN,  in the last 168 hours No results found for this basename: LIPASE, AMYLASE,  in the last 168 hours No results found for this basename: AMMONIA,  in the last 168 hours CBC:  Recent Labs Lab 08/18/13 0355 08/19/13 0605 08/20/13 0655 08/22/13 0650 08/23/13 0525  WBC 20.8* 12.3* 7.8 7.8 7.0  NEUTROABS 19.4*  --   --   --   --   HGB 9.4* 9.1* 9.1* 9.4* 9.4*  HCT 28.4* 28.4* 28.0* 29.0* 28.7*  MCV 97.3 97.3 96.9 97.3 96.3  PLT 138* 165 162 199 195   Cardiac Enzymes: No results found for this basename: CKTOTAL, CKMB, CKMBINDEX, TROPONINI,  in the last 168 hours BNP: BNP (last 3  results) No results found for this basename: PROBNP,  in the last 8760 hours CBG: No results found for this basename: GLUCAP,  in the last 168 hours     Signed:  Laterrance Nauta C  [facility] 08/24/2013, 11:17 AM

## 2013-08-24 NOTE — Progress Notes (Signed)
Physical Therapy Treatment Patient Details Name: [name] MRN: [id number] DOB: 1918/04/10 Today's Date: 08/24/2013 Time: 0925-0950 PT Time Calculation (min): 25 min  PT Assessment / Plan / Recommendation  History of Present Illness [name] is a 80 y.o. female with PMH significant for HTN, diverticulosis, DJD (affecting hip and mainly wheelchair bound), CAD (S/P CABG), diastolic heart failure (last EF 55%); CKD (stage 3, Cr baseline 1.54) and GERD; came to ED from PCP office due to approx 1.5 weeks complaints of abd pain (LLQ, intermittent, crampy and with associated fever). Patient had CT scan at PCP office that demonstrated air-fluid levels in colonic area and suggested pericolonic abscess; also with acute on chronic renal failure (Cr now in the 1.9 range) and elevated WBC's ( 20,000).    PT Comments   Pt performed transfer to and from bedside commode to simulate chair transfer.  Patient also tolerated some pre gait activities with assist and performed backward ambulation 5 steps. Patient was minimally ambulatory (walked a few steps to the beauty salon chair PTA) continue to feel patient will benefit greatly from HHPT upon discharge. Patient and family in agreement.   Follow Up Recommendations  Supervision for mobility/OOB;Home health PT           Equipment Recommendations  None recommended by PT    Recommendations for Other Services    Frequency Min 3X/week   Progress towards PT Goals Progress towards PT goals: Progressing toward goals  Plan Current plan remains appropriate    Precautions / Restrictions Precautions Precautions: Fall Restrictions Weight Bearing Restrictions: No   Pertinent Vitals/Pain Moderate pain in right hip ("not too bad")    Mobility  Bed Mobility Overal bed mobility: Needs Assistance Bed Mobility: Supine to Sit;Sit to Supine Supine to sit: Min assist Sit to supine: Min assist General bed mobility comments: VCs for positioning, assist to come  to upright, assist to elevate legs to return to supine Transfers Overall transfer level: Needs assistance Equipment used: Rolling walker (2 wheeled) Transfers: Sit to/from [employer] Sit to Stand: Min assist;Mod assist Stand pivot transfers: Min assist;Mod assist General transfer comment: Verbal cues for hand placement. Assist for safety/balance. Ambulation/Gait Ambulation/Gait assistance: Mod assist Ambulation Distance (Feet): 5 Feet (backwards) Assistive device: Rolling walker (2 wheeled) Gait Pattern/deviations: Step-to pattern;Trunk flexed;Narrow base of support;Shuffle      PT Goals (current goals can now be found in the care plan section) Acute Rehab PT Goals Patient Stated Goal: To go home soon PT Goal Formulation: With patient/family Time For Goal Achievement: 08/29/13 Potential to Achieve Goals: Good  Visit Information  Last PT Received On: 08/24/13 Assistance Needed: +1 History of Present Illness: [name] is a 80 y.o. female with PMH significant for HTN, diverticulosis, DJD (affecting hip and mainly wheelchair bound), CAD (S/P CABG), diastolic heart failure (last EF 55%); CKD (stage 3, Cr baseline 1.54) and GERD; came to ED from PCP office due to approx 1.5 weeks complaints of abd pain (LLQ, intermittent, crampy and with associated fever). Patient had CT scan at PCP office that demonstrated air-fluid levels in colonic area and suggested pericolonic abscess; also with acute on chronic renal failure (Cr now in the 1.9 range) and elevated WBC's ( 20,000).     Subjective Data  Subjective: I will try Patient Stated Goal: To go home soon   Cognition  Cognition Arousal/Alertness: Awake/alert Behavior During Therapy: WFL for tasks assessed/performed Overall Cognitive Status: Within Functional Limits for tasks assessed    Balance  General Comments General comments (skin integrity, edema, etc.): discussed home management and car transfer as well as  assist and techniques/ positioning to assist with transfers safely  End of Session PT - End of Session Equipment Utilized During Treatment: Gait belt Activity Tolerance: Patient limited by fatigue Patient left: in bed;with call bell/phone within reach;with family/visitor present   GP     Fabio AsaWerner, Ankit Degregorio J 08/24/2013, 9:53 AM Charlotte Crumbevon Bernadene Garside, PT DPT  [phone]

## 2013-08-24 NOTE — Progress Notes (Signed)
Discharge instructions reviewed with patient and daughter, questions answered, verbalized understanding.  Prescriptions given to daughter, reviewed care of JP and how to empty JP with daughter, verbalized understanding.  Patient taken via wheelchair to son's car to be taken home by son and daughter.  Patient in good condition at time of discharge from [facility].

## 2013-08-24 NOTE — Progress Notes (Signed)
Subjective: Pt doing well, no abdominal pain or N/V.  Tolerating diet well.  Drain output is 5033mL/24 hours.  Son at bedside.  She's excited to go home.  Objective: Vital signs in last 24 hours: Temp:  [97.9 F (36.6 C)-98.6 F (37 C)] 98.4 F (36.9 C) (03/10 0504) Pulse Rate:  [93-98] 98 (03/10 0504) Resp:  [18-20] 18 (03/10 0504) BP: (115-139)/(55-92) 115/55 mmHg (03/10 0504) SpO2:  [95 %-100 %] 95 % (03/10 0504) Weight:  [168 lb 14 oz (76.6 kg)] 168 lb 14 oz (76.6 kg) (03/10 0504) Last BM Date: 08/23/13  Intake/Output from previous day: 03/09 0701 - 03/10 0700 In: -  Out: 33 [Drains:33] Intake/Output this shift: Total I/O In: -  Out: 200 [Urine:200]  PE: Gen:  Alert, NAD, pleasant Abd: Soft, NT/ND, +BS, no HSM, incisions C/D/I, drain with mostly serosanguinous drainage, but some cloudy tissue like material in bulb as well 6633mL/24 hours output    Lab Results:   Recent Labs  08/22/13 0650 08/23/13 0525  WBC 7.8 7.0  HGB 9.4* 9.4*  HCT 29.0* 28.7*  PLT 199 195   BMET  Recent Labs  08/22/13 0650 08/23/13 0525  NA 141 145  K 4.3 4.2  CL 110 114*  CO2 18* 20  GLUCOSE 105* 107*  BUN 15 14  CREATININE 1.43* 1.36*  CALCIUM 8.0* 8.3*   PT/INR No results found for this basename: LABPROT, INR,  in the last 72 hours CMP     Component Value Date/Time   NA 145 08/23/2013 0525   K 4.2 08/23/2013 0525   CL 114* 08/23/2013 0525   CO2 20 08/23/2013 0525   GLUCOSE 107* 08/23/2013 0525   BUN 14 08/23/2013 0525   CREATININE 1.36* 08/23/2013 0525   CALCIUM 8.3* 08/23/2013 0525   PROT 5.9* 08/16/2013 1829   ALBUMIN 2.6* 08/16/2013 1829   AST 17 08/16/2013 1829   ALT 31 08/16/2013 1829   ALKPHOS 98 08/16/2013 1829   BILITOT 0.7 08/16/2013 1829   GFRNONAA 32* 08/23/2013 0525   GFRAA 38* 08/23/2013 0525   Lipase     Component Value Date/Time   LIPASE 23 04/17/2010 0535       Studies/Results: Ct Abdomen Pelvis Wo Contrast  08/23/2013   CLINICAL DATA:  Post drainage placement for  diverticular abscess. Hypotension. Elevated white blood cell count.  EXAM: CT ABDOMEN AND PELVIS WITHOUT CONTRAST  TECHNIQUE: Multidetector CT imaging of the abdomen and pelvis was performed following the standard protocol without intravenous contrast.  COMPARISON:  CT IMAGE GUIDED DRAINAGE BY PERCUTANEOUS CATHETER dated 08/17/2013; CT ABD/PELV WO CM dated 08/16/2013  FINDINGS: Interval development of moderate-sized bilateral pleural effusions with associated compressive atelectasis.  Cholecystectomy clips are noted. 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 No focal hepatic abnormality. Bilateral nonobstructing renal calculi are noted, largest 2 mm at the right upper renal pole image 25. 1.9 cm right mid renal cortical cyst image 29. Unenhanced adrenal glands, pancreas, and spleen are unremarkable.  Left lower quadrant anterior approach percutaneous drainage catheter is in place with its tip at the previously seen pericolonic abscess. There is minimal soft tissue density around the drainage catheter with the collection now measuring at most 4.7 x 2.5 cm image 59. Right ovary is unremarkable. Left ovary not visualized but no adnexal mass is seen. Uterus presumed surgically absent. Left total hip arthroplasty obscures detail on the lower pelvis due streak artifact. Right femoral pins partly visualized. No new area of bowel wall thickening or focal  segmental dilatation. No free air.  Multilevel disc degenerative change is noted with rightward curvature centered at L2. Extensive right hip degenerative change noted. No new acute osseous abnormality. T11 compression deformity reidentified.  IMPRESSION: Near complete resolution of previously seen left lower quadrant pericolonic presumed diverticular abscess, with drain remaining in place.  New moderate-sized bilateral pleural effusions with associated compressive atelectasis.  No new intra-abdominal or pelvic acute pathology.   Electronically Signed    By: Christiana Pellant M.D.   On: 08/23/2013 12:32    Anti-infectives: Anti-infectives   Start     Dose/Rate Route Frequency Ordered Stop   08/20/13 1200  piperacillin-tazobactam (ZOSYN) IVPB 3.375 g     3.375 g 12.5 mL/hr over 240 Minutes Intravenous Every 8 hours 08/20/13 1103     08/18/13 1030  piperacillin-tazobactam (ZOSYN) IVPB 2.25 g  Status:  Discontinued     2.25 g 100 mL/hr over 30 Minutes Intravenous 3 times per day 08/18/13 0945 08/20/13 1102   08/17/13 1915  ciprofloxacin (CIPRO) IVPB 400 mg  Status:  Discontinued     400 mg 200 mL/hr over 60 Minutes Intravenous Every 24 hours 08/16/13 1940 08/18/13 0933   08/16/13 1945  ciprofloxacin (CIPRO) IVPB 400 mg  Status:  Discontinued     400 mg 200 mL/hr over 60 Minutes Intravenous STAT 08/16/13 1941 08/16/13 2039   08/16/13 1915  ciprofloxacin (CIPRO) IVPB 400 mg  Status:  Discontinued     400 mg 200 mL/hr over 60 Minutes Intravenous Every 12 hours 08/16/13 1910 08/16/13 1940   08/16/13 1915  metroNIDAZOLE (FLAGYL) IVPB 500 mg  Status:  Discontinued     500 mg 100 mL/hr over 60 Minutes Intravenous Every 8 hours 08/16/13 1910 08/18/13 0933   08/16/13 1830  Ampicillin-Sulbactam (UNASYN) 3 g in sodium chloride 0.9 % 100 mL IVPB  Status:  Discontinued     3 g 100 mL/hr over 60 Minutes Intravenous STAT 08/16/13 1829 08/16/13 1910       Assessment/Plan Diverticulitis with abscess/IR drain on 08/17/13  Hypertension  CAD/HX OF CHF/CABG x 2, Mitral valve repair with 28 mm Seguin annuloplasty ring, 2007  Renal Insuffiencey - improving 1.36  Hx of goiter  Spinal stenosis with back and leg pain  Cholecystectomy 2011 (DB)   Plan:  1. Continue antibiotics, On Zosyn Day #6, WBC normal at 7.0 yesterday 2. IVF, pain control, antiemetics  3. Low fiber/soft diet  4. Not ambulatory at baseline due to severe arthritis 5. SCD's and heparin  6. IS  7. Recheck CT yesterday improved, will need to go home with JP Drain since still putting out  purulent drainage.  Have nursing teach drain management.  Continue antibiotics, switch to cipro/flagyl for 2 weeks upon discharge 8.  Okay to discharge from our perspective.  Follow up appointment with [facility] in 2 weeks    LOS: 8 days    DORT, Amman Bartel 08/24/2013, 8:15 AM Pager: [phone]

## 2013-08-30 ENCOUNTER — Telehealth (INDEPENDENT_AMBULATORY_CARE_PROVIDER_SITE_OTHER): Payer: Self-pay

## 2013-08-30 NOTE — Telephone Encounter (Signed)
Called and spoke to Merry[place]ice (patient's emergency contact) home health nurse came out and reattached JP drain.  Patient's reported output is less than 25 mL's.  Home Health nurse will come out to check patient's drain tomorrow.  Advised to call our office if she has any concerns or questions.  Verbalized understanding and agrees with plan.

## 2013-08-30 NOTE — Telephone Encounter (Signed)
Daughter states Jean RosenthalJackson pratt got caught on her mothers gown last night and bulb and tube was separated . Ms Jason NestJarvis applied tape to secure the bulb to the tube but not sure  if it is correct and unsure if it has suctioning . Advised her bring patient into the office for nurse only. Ms Jason NestJarvis states mother does have nurse visits by [facility]  but Nurse called out today and she does not think she can bring her into the office today. Advised her to call [facility] and inform them of what has happened and ask for them to send a nurse out today to check J.P, If [facility] can not send a nurse, she would need to come in for nurse only to elevate th J.P.

## 2013-09-08 ENCOUNTER — Ambulatory Visit (INDEPENDENT_AMBULATORY_CARE_PROVIDER_SITE_OTHER): Payer: Medicare Other | Admitting: General Surgery

## 2013-09-08 ENCOUNTER — Encounter (INDEPENDENT_AMBULATORY_CARE_PROVIDER_SITE_OTHER): Payer: Self-pay | Admitting: General Surgery

## 2013-09-08 VITALS — BP 108/78 | HR 80 | Temp 98.8°F | Resp 12 | Wt 140.0 lb

## 2013-09-08 DIAGNOSIS — K630 Abscess of intestine: Secondary | ICD-10-CM

## 2013-09-08 NOTE — Progress Notes (Signed)
History: Patient returns to the office for followup of pericolonic diverticular abscess. She was discharged from the hospital about 2 weeks ago after an approximately ten-day hospitalization. She underwent percutaneous drainage of her abscess with clinical improvement and CT showing improving abscess at discharge. She still had some purulent drainage in her percutaneous drain and this was left in place.  She her daughter present in the office today. They deny any abdominal pain or fever. Bowel movements are normal. Her appetite is not too great.  Exam: BP 108/78  Pulse 80  Temp(Src) 98.8 F (37.1 C)  Resp 12  Wt 140 lb (63.504 kg) General: Alert and conversant in no distress Abdomen: Soft and without any tenderness or mass JP drain has very scant serous sanguinous drainage and is only draining one or 2 cc a day  Assessment and plan: status post percutaneous drainage of peridiverticular abscess. Clinically I suspect this is resolved and she does not have a fistula. A 4 point drain I would repeat a CT scan with a drain injection to rule out fistula. Assuming this is okay we will get her back to the office and get her drain out following this.

## 2013-09-09 ENCOUNTER — Other Ambulatory Visit (INDEPENDENT_AMBULATORY_CARE_PROVIDER_SITE_OTHER): Payer: Self-pay

## 2013-09-10 ENCOUNTER — Other Ambulatory Visit (INDEPENDENT_AMBULATORY_CARE_PROVIDER_SITE_OTHER): Payer: Self-pay

## 2013-09-10 DIAGNOSIS — K630 Abscess of intestine: Secondary | ICD-10-CM

## 2013-09-13 ENCOUNTER — Other Ambulatory Visit (INDEPENDENT_AMBULATORY_CARE_PROVIDER_SITE_OTHER): Payer: Self-pay

## 2013-09-13 DIAGNOSIS — K5720 Diverticulitis of large intestine with perforation and abscess without bleeding: Secondary | ICD-10-CM

## 2013-09-14 ENCOUNTER — Other Ambulatory Visit (INDEPENDENT_AMBULATORY_CARE_PROVIDER_SITE_OTHER): Payer: Self-pay | Admitting: General Surgery

## 2013-09-14 ENCOUNTER — Telehealth (INDEPENDENT_AMBULATORY_CARE_PROVIDER_SITE_OTHER): Payer: Self-pay

## 2013-09-14 DIAGNOSIS — K5720 Diverticulitis of large intestine with perforation and abscess without bleeding: Secondary | ICD-10-CM

## 2013-09-14 NOTE — Telephone Encounter (Addendum)
Called and left message for Liborio NixonJanice to call our office to give appointment for CT and Drain Injection scheduled for 09/15/13 [facility]  Patient No solid foods 4 hours prior to test.  Patient can take medications and drink fluids.  Spoke to Tammy @ [facility] per Radiologist patient will not have to drink oral contrast.  Liborio NixonJanice made aware.

## 2013-09-15 ENCOUNTER — Ambulatory Visit (INDEPENDENT_AMBULATORY_CARE_PROVIDER_SITE_OTHER): Payer: Medicare Other | Admitting: General Surgery

## 2013-09-15 ENCOUNTER — Ambulatory Visit
Admission: RE | Admit: 2013-09-15 | Discharge: 2013-09-15 | Disposition: A | Discharge status: Home / Self Care | Payer: Medicare Other | Source: Ambulatory Visit | Attending: General Surgery | Admitting: General Surgery

## 2013-09-15 ENCOUNTER — Encounter (INDEPENDENT_AMBULATORY_CARE_PROVIDER_SITE_OTHER): Payer: Self-pay | Admitting: General Surgery

## 2013-09-15 VITALS — BP 120/64 | HR 80 | Temp 98.6°F | Resp 14

## 2013-09-15 DIAGNOSIS — K5720 Diverticulitis of large intestine with perforation and abscess without bleeding: Secondary | ICD-10-CM

## 2013-09-15 DIAGNOSIS — K5732 Diverticulitis of large intestine without perforation or abscess without bleeding: Secondary | ICD-10-CM

## 2013-09-15 MED ORDER — IOHEXOL 300 MG/ML  SOLN
100.0000 mL | Freq: Once | INTRAMUSCULAR | Status: AC | PRN
  Administered 2013-09-15: 100 mL via INTRAVENOUS

## 2013-09-15 NOTE — Progress Notes (Signed)
Chief complaint: Follow percutaneous drainage diverticular abscess  History: Patient returns for further followup status post percutaneous drainage of peridiverticular abscess. She continues to deny any abdominal pain or bowel problems or fever. Her daughter confirms this.  Exam: BP 120/64  Pulse 80  Temp(Src) 98.6 F (37 C) (Temporal)  Resp 14 General: Elderly alert Caucasian female in no distress Abdomen: Soft and nontender without mass or guarding  JP drain left lower quadrant with a tiny bit of bloody drainage  CT scan showed complete decompression of her abscess. Drainage injection showed no fistula to the colon.  Assessment and plan: Doing well following percutaneous drainage of abscess. I removed the drainage catheter intact without difficulty. I do not think she needs routine followup unless she develops symptoms and we went over things to watch for the patient and her daughter and they will return as needed.

## 2013-09-15 NOTE — Patient Instructions (Signed)
Please call as needed for any recurrent abdominal pain, fever or other concerns.

## 2013-09-18 ENCOUNTER — Inpatient Hospital Stay (HOSPITAL_COMMUNITY)
Admission: AD | Admit: 2013-09-18 | Discharge: 2013-09-23 | DRG: 372 | Disposition: A | Discharge status: Home Health | Payer: Medicare Other | Source: Other Acute Inpatient Hospital | Attending: General Surgery | Admitting: General Surgery

## 2013-09-18 DIAGNOSIS — M1710 Unilateral primary osteoarthritis, unspecified knee: Secondary | ICD-10-CM | POA: Diagnosis present

## 2013-09-18 DIAGNOSIS — I509 Heart failure, unspecified: Secondary | ICD-10-CM | POA: Diagnosis present

## 2013-09-18 DIAGNOSIS — Z885 Allergy status to narcotic agent status: Secondary | ICD-10-CM

## 2013-09-18 DIAGNOSIS — K5732 Diverticulitis of large intestine without perforation or abscess without bleeding: Secondary | ICD-10-CM

## 2013-09-18 DIAGNOSIS — A0472 Enterocolitis due to Clostridium difficile, not specified as recurrent: Principal | ICD-10-CM | POA: Diagnosis present

## 2013-09-18 DIAGNOSIS — Z7982 Long term (current) use of aspirin: Secondary | ICD-10-CM

## 2013-09-18 DIAGNOSIS — I2510 Atherosclerotic heart disease of native coronary artery without angina pectoris: Secondary | ICD-10-CM | POA: Diagnosis present

## 2013-09-18 DIAGNOSIS — Z881 Allergy status to other antibiotic agents status: Secondary | ICD-10-CM

## 2013-09-18 DIAGNOSIS — IMO0002 Reserved for concepts with insufficient information to code with codable children: Secondary | ICD-10-CM

## 2013-09-18 DIAGNOSIS — K5792 Diverticulitis of intestine, part unspecified, without perforation or abscess without bleeding: Secondary | ICD-10-CM | POA: Diagnosis present

## 2013-09-18 MED ORDER — PIPERACILLIN-TAZOBACTAM IN DEX 2-0.25 GM/50ML IV SOLN
2.2500 g | Freq: Three times a day (TID) | INTRAVENOUS | Status: DC
Start: 2013-09-19 — End: 2013-09-20
  Administered 2013-09-19 – 2013-09-20 (×4): 2.25 g via INTRAVENOUS
  Filled 2013-09-18 (×6): qty 50

## 2013-09-18 MED ORDER — TRAMADOL HCL 50 MG PO TABS
50.0000 mg | ORAL_TABLET | Freq: Three times a day (TID) | ORAL | Status: DC | PRN
  Administered 2013-09-18 – 2013-09-23 (×10): 50 mg via ORAL
  Filled 2013-09-18 (×10): qty 1

## 2013-09-18 MED ORDER — FENTANYL 25 MCG/HR TD PT72
25.0000 ug | MEDICATED_PATCH | TRANSDERMAL | Status: DC
  Administered 2013-09-20: 25 ug via TRANSDERMAL
  Filled 2013-09-18 (×2): qty 1

## 2013-09-18 MED ORDER — ACETAMINOPHEN 325 MG PO TABS
650.0000 mg | ORAL_TABLET | Freq: Four times a day (QID) | ORAL | Status: DC | PRN
  Administered 2013-09-19 – 2013-09-22 (×3): 650 mg via ORAL
  Filled 2013-09-18 (×3): qty 2

## 2013-09-18 MED ORDER — ACETAMINOPHEN 650 MG RE SUPP: 650.0000 mg | Freq: Four times a day (QID) | RECTAL | Status: DC | PRN

## 2013-09-18 MED ORDER — METOPROLOL SUCCINATE ER 25 MG PO TB24
50.0000 mg | ORAL_TABLET | Freq: Every day | ORAL | Status: DC
  Administered 2013-09-19 – 2013-09-23 (×5): 50 mg via ORAL
  Filled 2013-09-18 (×7): qty 2

## 2013-09-18 MED ORDER — ENOXAPARIN SODIUM 30 MG/0.3ML ~~LOC~~ SOLN
30.0000 mg | SUBCUTANEOUS | Status: DC
  Administered 2013-09-18 – 2013-09-22 (×5): 30 mg via SUBCUTANEOUS
  Filled 2013-09-18 (×6): qty 0.3

## 2013-09-18 MED ORDER — FAMOTIDINE IN NACL 20-0.9 MG/50ML-% IV SOLN
20.0000 mg | Freq: Two times a day (BID) | INTRAVENOUS | Status: DC
  Administered 2013-09-18 – 2013-09-19 (×2): 20 mg via INTRAVENOUS
  Filled 2013-09-18 (×3): qty 50

## 2013-09-18 MED ORDER — PIPERACILLIN-TAZOBACTAM 3.375 G IVPB
3.3750 g | Freq: Three times a day (TID) | INTRAVENOUS | Status: DC
  Filled 2013-09-18 (×2): qty 50

## 2013-09-18 MED ORDER — MORPHINE SULFATE 2 MG/ML IJ SOLN: 1.0000 mg | INTRAMUSCULAR | Status: DC | PRN

## 2013-09-18 MED ORDER — SODIUM CHLORIDE 0.9 % IV SOLN
INTRAVENOUS | Status: DC
  Administered 2013-09-18 – 2013-09-22 (×8): via INTRAVENOUS

## 2013-09-18 MED ORDER — PIPERACILLIN-TAZOBACTAM 3.375 G IVPB 30 MIN
3.3750 g | Freq: Once | INTRAVENOUS | Status: AC
  Administered 2013-09-18: 3.375 g via INTRAVENOUS
  Filled 2013-09-18: qty 50

## 2013-09-18 MED ORDER — FENTANYL 25 MCG/HR TD PT72: 25.0000 ug | MEDICATED_PATCH | TRANSDERMAL | Status: DC

## 2013-09-18 MED ORDER — ONDANSETRON HCL 4 MG/2ML IJ SOLN: 4.0000 mg | Freq: Four times a day (QID) | INTRAMUSCULAR | Status: DC | PRN

## 2013-09-18 NOTE — H&P (Signed)
[name] is an 80 y.o. female.   Chief Complaint: ab pain HPI: 68 yof who was recently admitted with diverticular abscess.  She was sent home and this appeared to resolve on ct scan. She completed her abx about 9 days ago.  She had a drain study that showed no persistent connection and the drain was removed this week. Her temperature slowly increased and this am she had chills and temp of 101 at home. She has been eating some and has been having bms/passing flatus. Doesn't really complain of ab pain. She was seen in [place] er and transferred for likely recurrence.  Results from outside facility: UA negative for infection, NA 146, K 3.9, BUN 25, Cr 2.1, glucose 111, wbc 13.8, hct 31.4, plt 157 cxr for which copy was provided negative for pna Had blood cultures drawn at [facility], no abx  Past Medical History  Diagnosis Date  . Headache   . Diverticulosis   . CHF (congestive heart failure)   . Coronary artery disease   . Renal disorder   . Arthritis     PSH: cholecystectomy, tah  No family history on file. Social History:  reports that she has never smoked. She does not have any smokeless tobacco history on file. She reports that she does not drink alcohol. Her drug history is not on file.  Allergies:  Allergies  Allergen Reactions  . Codeine     unknown  . Erythromycin     unknown    Medications Prior to Admission  Medication Sig Dispense Refill  . acetaminophen (TYLENOL) 500 MG tablet Take 500 mg by mouth every 6 (six) hours as needed for mild pain.      Marland Kitchen aspirin 81 MG tablet Take 81 mg by mouth daily.      . famotidine (PEPCID) 20 MG tablet Take 20 mg by mouth 2 (two) times daily.      . fentaNYL (DURAGESIC - DOSED MCG/HR) 25 MCG/HR patch Place 25 mcg onto the skin every 3 (three) days.      . fluticasone (VERAMYST) 27.5 MCG/SPRAY nasal spray Place 2 sprays into the nose daily as needed for rhinitis.      . furosemide (LASIX) 40 MG tablet Take 40-80 mg by mouth daily as  needed for fluid. Take one tablet if fluid is ok.. And take two tablets by mouth if fluid gets bad      . metoprolol succinate (TOPROL-XL) 50 MG 24 hr tablet Take 25-50 mg by mouth daily. Take with or immediately following a meal.      . metroNIDAZOLE (FLAGYL) 500 MG tablet Take 1 tablet (500 mg total) by mouth 3 (three) times daily.  42 tablet  0  . traMADol (ULTRAM) 50 MG tablet Take 1 tablet (50 mg total) by mouth every 8 (eight) hours as needed for moderate pain.  30 tablet  0    Review of Systems  Constitutional: Positive for fever, chills and malaise/fatigue.  Respiratory: Negative for shortness of breath.   Gastrointestinal: Negative for nausea, vomiting and abdominal pain.    Blood pressure 106/48, pulse 88, temperature 98.9 F (37.2 C), temperature source Oral, resp. rate 20, height 5\' 8"  (1.727 m), weight 148 lb (67.132 kg), SpO2 96.00%. Physical Exam  Vitals reviewed. Constitutional: She appears well-developed and well-nourished.  Eyes: No scleral icterus.  Cardiovascular: Normal rate, regular rhythm and normal heart sounds.   Respiratory: Effort normal and breath sounds normal. She has no wheezes. She has no rales.  GI: Soft. Bowel sounds are normal. She exhibits no distension. There is tenderness (mild) in the left lower quadrant.    Lymphadenopathy:    She has no cervical adenopathy.     Assessment/Plan Likely recurrent diverticulitis   Clinically appears and historically appears she has recurrent diverticulitis.  Will hydrate overnight, place on abx, recheck labs in am and consider ct with contrast if cr is better.  We discussed possibilities including further abx, another drain or even surgery (although she understands this will be last resort).  I discussed this plan with her and her family.  Patrecia Veiga 09/18/2013, 8:15 PM

## 2013-09-18 NOTE — Progress Notes (Signed)
ANTIBIOTIC CONSULT NOTE - INITIAL  Pharmacy Consult for zosyn Indication: diverticulitis  Allergies  Allergen Reactions  . Codeine Nausea And Vomiting  . Erythromycin Nausea And Vomiting    Patient Measurements: Height: 5\' 8"  (172.7 cm) Weight: 148 lb (67.132 kg) IBW/kg (Calculated) : 63.9   Vital Signs: Temp: 98.9 F (37.2 C) (04/04 1922) Temp src: Oral (04/04 1922) BP: 106/48 mmHg (04/04 1922) Pulse Rate: 88 (04/04 1922) Intake/Output from previous day:   Intake/Output from this shift:    Labs: No results found for this basename: WBC, HGB, PLT, LABCREA, CREATININE,  in the last 72 hours Estimated Creatinine Clearance: 26.1 ml/min (by C-G formula based on Cr of 1.36). No results found for this basename: VANCOTROUGH, VANCOPEAK, VANCORANDOM, GENTTROUGH, GENTPEAK, GENTRANDOM, TOBRATROUGH, TOBRAPEAK, TOBRARND, AMIKACINPEAK, AMIKACINTROU, AMIKACIN,  in the last 72 hours   Microbiology: No results found for this or any previous visit (from the past 720 hour(s)).  Medical History: Past Medical History  Diagnosis Date  . Headache   . Diverticulosis   . CHF (congestive heart failure)   . Coronary artery disease   . Renal disorder   . Arthritis     Medications:  Prescriptions prior to admission  Medication Sig Dispense Refill  . acetaminophen (TYLENOL) 500 MG tablet Take 500 mg by mouth every 6 (six) hours as needed for mild pain.      Marland Kitchen. aspirin 81 MG tablet Take 81 mg by mouth daily.      . famotidine (PEPCID) 20 MG tablet Take 20 mg by mouth every morning.       . fentaNYL (DURAGESIC - DOSED MCG/HR) 25 MCG/HR patch Place 25 mcg onto the skin every 3 (three) days.      . furosemide (LASIX) 40 MG tablet Take 40 mg by mouth every morning.       . metoprolol succinate (TOPROL-XL) 50 MG 24 hr tablet Take 50 mg by mouth every morning. Take with or immediately following a meal.      . traMADol (ULTRAM) 50 MG tablet Take 1 tablet (50 mg total) by mouth every 8 (eight) hours  as needed for moderate pain.  30 tablet  0   Assessment: 80 yo F to start zosyn for diverticulitis.   [facility]  Per MD note, [facility]. [facility] creat 2.1, wbc 13.8.  Wt 67.1. Creat cl ~ 18 ml/min.   Plan:  1. Zosyn 3.375 gm IV x 1 dose over 30 minutes then zosyn 2.25 gm IV q8h 2. F/u renal function, wbc, temp, culture data, clinical course Herby AbrahamMichelle T. Geovani Tootle, Pharm.D. [phone] 09/18/2013 8:51 PM

## 2013-09-19 DIAGNOSIS — R509 Fever, unspecified: Secondary | ICD-10-CM

## 2013-09-19 DIAGNOSIS — N179 Acute kidney failure, unspecified: Secondary | ICD-10-CM

## 2013-09-19 LAB — CBC
HCT: 35.2 % — ABNORMAL LOW (ref 36.0–46.0)
Hemoglobin: 11.3 g/dL — ABNORMAL LOW (ref 12.0–15.0)
MCH: 31.6 pg (ref 26.0–34.0)
MCHC: 32.1 g/dL (ref 30.0–36.0)
MCV: 98.3 fL (ref 78.0–100.0)
Platelets: 168 10*3/uL (ref 150–400)
RBC: 3.58 MIL/uL — ABNORMAL LOW (ref 3.87–5.11)
RDW: 13.5 % (ref 11.5–15.5)
WBC: 14 10*3/uL — AB (ref 4.0–10.5)

## 2013-09-19 LAB — BASIC METABOLIC PANEL
BUN: 25 mg/dL — AB (ref 6–23)
CHLORIDE: 104 meq/L (ref 96–112)
CO2: 25 mEq/L (ref 19–32)
Calcium: 8.3 mg/dL — ABNORMAL LOW (ref 8.4–10.5)
Creatinine, Ser: 1.67 mg/dL — ABNORMAL HIGH (ref 0.50–1.10)
GFR, EST AFRICAN AMERICAN: 29 mL/min — AB (ref >90)
GFR, EST NON AFRICAN AMERICAN: 25 mL/min — AB (ref >90)
Glucose, Bld: 93 mg/dL (ref 70–99)
POTASSIUM: 3.8 meq/L (ref 3.7–5.3)
SODIUM: 146 meq/L (ref 137–147)

## 2013-09-19 MED ORDER — FAMOTIDINE IN NACL 20-0.9 MG/50ML-% IV SOLN
20.0000 mg | INTRAVENOUS | Status: DC
Start: 2013-09-20 — End: 2013-09-21
  Administered 2013-09-20 – 2013-09-21 (×2): 20 mg via INTRAVENOUS
  Filled 2013-09-19 (×2): qty 50

## 2013-09-19 NOTE — Progress Notes (Signed)
Patient ID: [name], female   DOB: 09-18-1917, 80 y.o.   MRN: [id number]    Subjective: No complaints this morning except for chronic arthritic pain in her knees. Denies abdominal pain or nausea.  Objective: Vital signs in last 24 hours: Temp:  [98.3 F (36.8 C)-98.9 F (37.2 C)] 98.5 F (36.9 C) (04/05 0647) Pulse Rate:  [87-98] 87 (04/05 0647) Resp:  [20] 20 (04/05 0647) BP: (106-141)/(48-73) 122/61 mmHg (04/05 0647) SpO2:  [96 %-99 %] 97 % (04/05 0647) Weight:  [148 lb (67.132 kg)] 148 lb (67.132 kg) (04/04 1922)    Intake/Output from previous day: 04/04 0701 - 04/05 0700 In: 240 [P.O.:240] Out: 300 [Urine:300] Intake/Output this shift:    General appearance: alert, cooperative and no distress GI: normal findings: soft, non-tender and no guarding or palpable mass in the left lower quadrant  Lab Results:   Recent Labs  09/19/13 0540  WBC 14.0*  HGB 11.3*  HCT 35.2*  PLT 168   BMET  Recent Labs  09/19/13 0540  NA 146  K 3.8  CL 104  CO2 25  GLUCOSE 93  BUN 25*  CREATININE 1.67*  CALCIUM 8.3*     Studies/Results: No results found.  Anti-infectives: Anti-infectives   Start     Dose/Rate Route Frequency Ordered Stop   09/19/13 0600  piperacillin-tazobactam (ZOSYN) IVPB 2.25 g     2.25 g 100 mL/hr over 30 Minutes Intravenous 3 times per day 09/18/13 2055     09/18/13 2200  piperacillin-tazobactam (ZOSYN) IVPB 3.375 g  Status:  Discontinued     3.375 g 12.5 mL/hr over 240 Minutes Intravenous 3 times per day 09/18/13 2049 09/18/13 2054   09/18/13 2200  piperacillin-tazobactam (ZOSYN) IVPB 3.375 g     3.375 g 100 mL/hr over 30 Minutes Intravenous  Once 09/18/13 2055 09/19/13 0019      Assessment/Plan: Status post drainage of large diverticular abscess several weeks ago. Drainage catheter removed last week after CT showed resolution of abscess and no fistula on drain injection. Now presents with fever and acute renal insufficiency. Abdominal  tenderness described at presentation which does not appear to be present today. Renal insufficiency improved. Likely flareup of diverticulitis or recurrent abscess. Improved on antibiotics. Continue current treatment. Likely will need CT scan as her renal function normalizes. Start clear liquid diet    LOS: 1 day    [name] T 09/19/2013

## 2013-09-20 LAB — CBC
HCT: 30.2 % — ABNORMAL LOW (ref 36.0–46.0)
HEMOGLOBIN: 9.6 g/dL — AB (ref 12.0–15.0)
MCH: 31.1 pg (ref 26.0–34.0)
MCHC: 31.8 g/dL (ref 30.0–36.0)
MCV: 97.7 fL (ref 78.0–100.0)
Platelets: 140 10*3/uL — ABNORMAL LOW (ref 150–400)
RBC: 3.09 MIL/uL — AB (ref 3.87–5.11)
RDW: 13.6 % (ref 11.5–15.5)
WBC: 7.6 10*3/uL (ref 4.0–10.5)

## 2013-09-20 LAB — URINALYSIS, ROUTINE W REFLEX MICROSCOPIC
BILIRUBIN URINE: NEGATIVE
Glucose, UA: NEGATIVE mg/dL
HGB URINE DIPSTICK: NEGATIVE
Ketones, ur: NEGATIVE mg/dL
Nitrite: NEGATIVE
PH: 6.5 (ref 5.0–8.0)
Protein, ur: NEGATIVE mg/dL
SPECIFIC GRAVITY, URINE: 1.021 (ref 1.005–1.030)
Urobilinogen, UA: 1 mg/dL (ref 0.0–1.0)

## 2013-09-20 LAB — URINE MICROSCOPIC-ADD ON

## 2013-09-20 LAB — BASIC METABOLIC PANEL
BUN: 22 mg/dL (ref 6–23)
CHLORIDE: 108 meq/L (ref 96–112)
CO2: 26 mEq/L (ref 19–32)
Calcium: 8.2 mg/dL — ABNORMAL LOW (ref 8.4–10.5)
Creatinine, Ser: 1.58 mg/dL — ABNORMAL HIGH (ref 0.50–1.10)
GFR calc Af Amer: 31 mL/min — ABNORMAL LOW (ref >90)
GFR, EST NON AFRICAN AMERICAN: 27 mL/min — AB (ref >90)
Glucose, Bld: 88 mg/dL (ref 70–99)
Potassium: 3.9 mEq/L (ref 3.7–5.3)
SODIUM: 147 meq/L (ref 137–147)

## 2013-09-20 LAB — CLOSTRIDIUM DIFFICILE BY PCR: Toxigenic C. Difficile by PCR: POSITIVE — AB

## 2013-09-20 MED ORDER — METRONIDAZOLE 500 MG PO TABS
500.0000 mg | ORAL_TABLET | Freq: Three times a day (TID) | ORAL | Status: DC
  Administered 2013-09-20 – 2013-09-23 (×9): 500 mg via ORAL
  Filled 2013-09-20 (×13): qty 1

## 2013-09-20 NOTE — Clinical Documentation Improvement (Signed)
PLEASE SPECIFY TYPE AND ACUITY OF CHF Possible Clinical Conditions?  Chronic Systolic Congestive Heart Failure Chronic Diastolic Congestive Heart Failure Chronic Systolic & Diastolic Congestive Heart Failure Acute Systolic Congestive Heart Failure Acute Diastolic Congestive Heart Failure Acute Systolic & Diastolic Congestive Heart Failure Acute on Chronic Systolic Congestive Heart Failure Acute on Chronic Diastolic Congestive Heart Failure Acute on Chronic Systolic & Diastolic Congestive Heart Failure Other Condition Cannot Clinically Determine  Supporting Information: Signs & Symptoms:"Hx of CHF"  Treatment: LASIX) 40 MG tablet [[id number]][[id number]]   Order Details    Dose: 40 mg Route: Oral      Thank You, Nevin BloodgoodJoan B Joeph Szatkowski, RN, BSN, CCDS, Clinical Documentation Specialist:  [phone]   [phone]=Cell [place]- Health Information Management

## 2013-09-20 NOTE — Evaluation (Signed)
Physical Therapy Evaluation Patient Details Name: [name] MRN: [id number] DOB: 02-05-1918 Today's Date: 09/20/2013   History of Present Illness  [age] yof who was recently admitted with diverticular abscess.  She was sent home and this appeared to resolve on ct scan. She completed her abx about 9 days ago.  She had a drain study that showed no persistent connection and the drain was removed this week. Her temperature slowly increased and this am she had chills and temp of 101 at home. She has been eating some and has been having bms/passing flatus. Doesn't really complain of ab pain. Was mostly w/c bound at home  Clinical Impression  Pt with generalized weakness from recent hospitalization and fever. Recommend supervision for all transfers and ambulation. PT will follow acutely to continue with mobility and strengthening. Recommend resumption of HHPT at d/c.    Follow Up Recommendations Home health PT;Supervision/Assistance - 24 hour    Equipment Recommendations  None recommended by PT    Recommendations for Other Services       Precautions / Restrictions Precautions Precautions: Fall Restrictions Weight Bearing Restrictions: No      Mobility  Bed Mobility               General bed mobility comments: pt up in chair  Transfers Overall transfer level: Needs assistance Equipment used: Rolling walker (2 wheeled) Transfers: Sit to/from Stand Sit to Stand: Min assist         General transfer comment: vc's for hand placement, min A for power up  Ambulation/Gait Ambulation/Gait assistance: Min assist;Mod assist Ambulation Distance (Feet): 16 Feet Assistive device: Rolling walker (2 wheeled) Gait Pattern/deviations: Step-through pattern;Decreased stride length;Shuffle;Trunk flexed Gait velocity: very slow   General Gait Details: pt reports that she was told by therapist to take wt through arms to offset leg weakness and she was able to do this with inital ambulation  and needed only min A, but by 8' was becoming very fatigued and required mod A to remain upright. Right heel off floor and she reports that if she pushes it to floor, her right knee will buckle.  Stairs            Wheelchair Mobility    Modified Rankin (Stroke Patients Only)       Balance Overall balance assessment: Needs assistance Sitting-balance support: Feet supported;No upper extremity supported Sitting balance-Leahy Scale: Fair     Standing balance support: Bilateral upper extremity supported;During functional activity Standing balance-Leahy Scale: Poor Standing balance comment: heavy reliance on UE's                             Pertinent Vitals/Pain VSS    Home Living Family/patient expects to be discharged to:: Private residence Living Arrangements: Children Available Help at Discharge: Family;Available PRN/intermittently Type of Home: House Home Access: Ramped entrance     Home Layout: One level Home Equipment: Wheelchair - [employer]manual;Walker - 2 wheels;Bedside commode;Shower seat Additional Comments: pt had begun receiving HHPT before returning to hospital    Prior Function Level of Independence: Needs assistance   Gait / Transfers Assistance Needed: since leaving hospital, needed assistance for transfers and ambulation  ADL's / Homemaking Assistance Needed: Pt. familty A with bathing and home management tasks.  Comments: Family stays with her evenings and nights.     Hand Dominance        Extremity/Trunk Assessment   Upper Extremity Assessment: Generalized weakness  Lower Extremity Assessment: RLE deficits/detail;LLE deficits/detail RLE Deficits / Details: generalized weakness all 4 extremities but right leg profoundly weak from knee OA, knee occasionally buckles LLE Deficits / Details: generalized weakness  Cervical / Trunk Assessment: Kyphotic  Communication   Communication: HOH  Cognition Arousal/Alertness:  Awake/alert Behavior During Therapy:  (tearful) Overall Cognitive Status: Within Functional Limits for tasks assessed                      General Comments      Exercises General Exercises - Lower Extremity Ankle Circles/Pumps: AROM;Both;10 reps;Seated Long Arc Quad: AROM;Both;5 reps;Seated Straight Leg Raises: AAROM;Both;5 reps;Seated      Assessment/Plan    PT Assessment Patient needs continued PT services  PT Diagnosis Difficulty walking;Abnormality of gait;Generalized weakness   PT Problem List Decreased strength;Decreased range of motion;Decreased activity tolerance;Decreased balance;Decreased mobility;Cardiopulmonary status limiting activity;Decreased safety awareness;Decreased knowledge of use of DME;Decreased knowledge of precautions  PT Treatment Interventions DME instruction;Gait training;Functional mobility training;Therapeutic activities;Therapeutic exercise;Balance training;Patient/family education;Neuromuscular re-education   PT Goals (Current goals can be found in the Care Plan section) Acute Rehab PT Goals Patient Stated Goal: return home, pt teary when talking about having to come back into hospital PT Goal Formulation: With patient Time For Goal Achievement: 10/04/13 Potential to Achieve Goals: Fair    Frequency Min 3X/week   Barriers to discharge   supportive family    Co-evaluation               End of Session Equipment Utilized During Treatment: Gait belt Activity Tolerance: Patient limited by fatigue;Patient limited by lethargy Patient left: in chair;with call bell/phone within reach;with family/visitor present Nurse Communication: Mobility status         Time: 1247-1309 PT Time Calculation (min): 22 min   Charges:   PT Evaluation $Initial PT Evaluation Tier I: 1 Procedure PT Treatments $Gait Training: 8-22 mins   PT G Codes:         Lyanne Co, PT  Acute Rehab Services  [phone]  Lyanne Co 09/20/2013, 2:46  PM

## 2013-09-20 NOTE — Progress Notes (Signed)
  Subjective: Pt with no acute changes.  Min abd pain and no changes in quality of abd pain.  Objective: Vital signs in last 24 hours: Temp:  [98 F (36.7 C)-99.9 F (37.7 C)] 98.7 F (37.1 C) (04/06 0512) Pulse Rate:  [83-94] 83 (04/06 0512) Resp:  [20] 20 (04/06 0512) BP: (117-128)/(51-64) 128/61 mmHg (04/06 0512) SpO2:  [95 %-97 %] 97 % (04/06 0512)    Intake/Output from previous day: 04/05 0701 - 04/06 0700 In: 4051.7 [P.O.:840; I.V.:3111.7; IV Piggyback:100] Out: 200 [Urine:200] Intake/Output this shift:    General appearance: alert and cooperative Resp: clear to auscultation bilaterally Cardio: regular rate and rhythm, S1, S2 normal, no murmur, click, rub or gallop GI: soft, NTTP, ND, active BS  Lab Results:   Recent Labs  09/19/13 0540 09/20/13 0614  WBC 14.0* 7.6  HGB 11.3* 9.6*  HCT 35.2* 30.2*  PLT 168 140*   BMET  Recent Labs  09/19/13 0540 09/20/13 0614  NA 146 147  K 3.8 3.9  CL 104 108  CO2 25 26  GLUCOSE 93 88  BUN 25* 22  CREATININE 1.67* 1.58*  CALCIUM 8.3* 8.2*   PT/INR No results found for this basename: LABPROT, INR,  in the last 72 hours ABG No results found for this basename: PHART, PCO2, PO2, HCO3,  in the last 72 hours  Studies/Results: No results found.  Anti-infectives: Anti-infectives   Start     Dose/Rate Route Frequency Ordered Stop   09/19/13 0600  piperacillin-tazobactam (ZOSYN) IVPB 2.25 g     2.25 g 100 mL/hr over 30 Minutes Intravenous 3 times per day 09/18/13 2055     09/18/13 2200  piperacillin-tazobactam (ZOSYN) IVPB 3.375 g  Status:  Discontinued     3.375 g 12.5 mL/hr over 240 Minutes Intravenous 3 times per day 09/18/13 2049 09/18/13 2054   09/18/13 2200  piperacillin-tazobactam (ZOSYN) IVPB 3.375 g     3.375 g 100 mL/hr over 30 Minutes Intravenous  Once 09/18/13 2055 09/19/13 0019      Assessment/Plan: 80 y/o F with ?recurrent diverticulitis 1. con't abx 2. Will repeat UA/Cx 3. PT to assist  amulation  LOS: 2 days    Marigene Ehlersamirez Jr., [facility] 09/20/2013

## 2013-09-21 ENCOUNTER — Encounter (HOSPITAL_COMMUNITY): Payer: Self-pay | Admitting: *Deleted

## 2013-09-21 DIAGNOSIS — A0472 Enterocolitis due to Clostridium difficile, not specified as recurrent: Secondary | ICD-10-CM

## 2013-09-21 LAB — URINE CULTURE
COLONY COUNT: NO GROWTH
CULTURE: NO GROWTH
SPECIAL REQUESTS: NORMAL

## 2013-09-21 MED ORDER — FAMOTIDINE 20 MG PO TABS
20.0000 mg | ORAL_TABLET | Freq: Every day | ORAL | Status: DC
  Administered 2013-09-22 – 2013-09-23 (×2): 20 mg via ORAL
  Filled 2013-09-21 (×2): qty 1

## 2013-09-21 MED ORDER — SACCHAROMYCES BOULARDII 250 MG PO CAPS
250.0000 mg | ORAL_CAPSULE | Freq: Two times a day (BID) | ORAL | Status: DC
  Administered 2013-09-21 – 2013-09-23 (×5): 250 mg via ORAL
  Filled 2013-09-21 (×7): qty 1

## 2013-09-21 NOTE — Progress Notes (Signed)
Physical Therapy Treatment Patient Details Name: [name] MRN: [id number] DOB: Apr 13, 1918 Today's Date: 09/21/2013    History of Present Illness 29 yof who was recently admitted with diverticular abscess.  She was sent home and this appeared to resolve on ct scan. She completed her abx about 9 days ago.  She had a drain study that showed no persistent connection and the drain was removed this week. Her temperature slowly increased and this am she had chills and temp of 101 at home. She has been eating some and has been having bms/passing flatus. Doesn't really complain of ab pain. Was mostly w/c bound at home    PT Comments    Pt with more pain in back and right knee today and was unwilling to get OOB, performed exercises in bed and edge of bed and attempted sit to stand but pt unable due to back pain. Encouraged her in bed to chair with nursing later today. PT will continue to follow.   Follow Up Recommendations  Home health PT;Supervision/Assistance - 24 hour     Equipment Recommendations  None recommended by PT    Recommendations for Other Services       Precautions / Restrictions Precautions Precautions: Fall Restrictions Weight Bearing Restrictions: No    Mobility  Bed Mobility Overal bed mobility: Needs Assistance Bed Mobility: Supine to Sit;Sit to Supine     Supine to sit: Mod assist Sit to supine: Mod assist   General bed mobility comments: mod A to help pt push up from bed into upright and mod A to assist with lower body back into bed  Transfers Overall transfer level: Needs assistance Equipment used: None Transfers: Sit to/from Stand Sit to Stand: Mod assist         General transfer comment: attempted sit to stand transfer 2x with mod A from therapist but pt unable to achieve standing stating that it caused her back to hurt too much today. She was unwilling to squatpivot to chair  Ambulation/Gait             General Gait Details: pt deferred  due to pain   Stairs            Wheelchair Mobility    Modified Rankin (Stroke Patients Only)       Balance Overall balance assessment: Needs assistance Sitting-balance support: Feet supported;No upper extremity supported Sitting balance-Leahy Scale: Good Sitting balance - Comments: able to perform LE exercises in sitting with no LOB                            Cognition Arousal/Alertness: Awake/alert Behavior During Therapy: Flat affect Overall Cognitive Status: Within Functional Limits for tasks assessed                      Exercises General Exercises - Lower Extremity Ankle Circles/Pumps: AROM;Both;10 reps;Supine Long Arc Quad: AROM;Both;10 reps;Seated Heel Slides: AROM;Both;10 reps;Supine Hip ABduction/ADduction: AROM;Both;10 reps;Supine Straight Leg Raises: AROM;AAROM;Both;10 reps;Supine Hip Flexion/Marching: AROM;Both;10 reps;Seated Toe Raises: AROM;Both;15 reps;Seated Heel Raises: AROM;Both;15 reps;Seated    General Comments        Pertinent Vitals/Pain VSS    Home Living                      Prior Function            PT Goals (current goals can now be found in the care plan section) Acute Rehab  PT Goals Patient Stated Goal: return home PT Goal Formulation: With patient Time For Goal Achievement: 10/04/13 Potential to Achieve Goals: Fair Progress towards PT goals: Progressing toward goals    Frequency  Min 3X/week    PT Plan Current plan remains appropriate    Co-evaluation             End of Session Equipment Utilized During Treatment: Gait belt Activity Tolerance: Patient limited by pain;Patient limited by fatigue Patient left: in bed;with call bell/phone within reach;with family/visitor present     Time: [id number] PT Time Calculation (min): 23 min  Charges:  $Therapeutic Exercise: 8-22 mins $Therapeutic Activity: 8-22 mins                    G Codes:     Lyanne CoVictoria Kamylle Axelson, PT  Acute Rehab  Services  [phone]  Lyanne CoManess, Chetan Mehring 09/21/2013, 12:17 PM

## 2013-09-21 NOTE — Progress Notes (Signed)
Patient ID: [name], female   DOB: [birth year], 80 y.o.   MRN: [id number]    Subjective: Pt feels ok today.  No pain.  Wants more than clear liquids.  Diarrhea is improving today  Objective: Vital signs in last 24 hours: Temp:  [98.2 F (36.8 C)-98.6 F (37 C)] 98.2 F (36.8 C) (04/07 0546) Pulse Rate:  [90-98] 98 (04/07 0546) Resp:  [17-19] 17 (04/07 0546) BP: (128-142)/(64-82) 142/82 mmHg (04/07 0546) SpO2:  [94 %-97 %] 94 % (04/07 0546) Last BM Date: 09/20/13  Intake/Output from previous day: 04/06 0701 - 04/07 0700 In: 2500 [P.O.:100; I.V.:2400] Out: 425 [Urine:425] Intake/Output this shift: Total I/O In: 300 [P.O.:300] Out: 100 [Urine:100]  PE: Abd: soft, NT, ND, +BS Heart: regular Lungs: CTAB  Lab Results:   Recent Labs  09/19/13 0540 09/20/13 0614  WBC 14.0* 7.6  HGB 11.3* 9.6*  HCT 35.2* 30.2*  PLT 168 140*   BMET  Recent Labs  09/19/13 0540 09/20/13 0614  NA 146 147  K 3.8 3.9  CL 104 108  CO2 25 26  GLUCOSE 93 88  BUN 25* 22  CREATININE 1.67* 1.58*  CALCIUM 8.3* 8.2*   PT/INR No results found for this basename: LABPROT, INR,  in the last 72 hours CMP     Component Value Date/Time   NA 147 09/20/2013 0614   K 3.9 09/20/2013 0614   CL 108 09/20/2013 0614   CO2 26 09/20/2013 0614   GLUCOSE 88 09/20/2013 0614   BUN 22 09/20/2013 0614   CREATININE 1.58* 09/20/2013 0614   CALCIUM 8.2* 09/20/2013 0614   PROT 5.9* 08/16/2013 1829   ALBUMIN 2.6* 08/16/2013 1829   AST 17 08/16/2013 1829   ALT 31 08/16/2013 1829   ALKPHOS 98 08/16/2013 1829   BILITOT 0.7 08/16/2013 1829   GFRNONAA 27* 09/20/2013 0614   GFRAA 31* 09/20/2013 0614   Lipase     Component Value Date/Time   LIPASE 23 04/17/2010 0535       Studies/Results: No results found.  Anti-infectives: Anti-infectives   Start     Dose/Rate Route Frequency Ordered Stop   09/20/13 1400  metroNIDAZOLE (FLAGYL) tablet 500 mg     500 mg Oral 3 times per day 09/20/13 1141     09/19/13 0600   piperacillin-tazobactam (ZOSYN) IVPB 2.25 g  Status:  Discontinued     2.25 g 100 mL/hr over 30 Minutes Intravenous 3 times per day 09/18/13 2055 09/20/13 1259   09/18/13 2200  piperacillin-tazobactam (ZOSYN) IVPB 3.375 g  Status:  Discontinued     3.375 g 12.5 mL/hr over 240 Minutes Intravenous 3 times per day 09/18/13 2049 09/18/13 2054   09/18/13 2200  piperacillin-tazobactam (ZOSYN) IVPB 3.375 g     3.375 g 100 mL/hr over 30 Minutes Intravenous  Once 09/18/13 2055 09/19/13 0019       Assessment/Plan  1. Recent diverticulitis 2. C diff colitis  Plan: 1. Low fat diet 2. Cont flagyl D2/14 3. Add florastor   LOS: 3 days    Rosco Harriott E 09/21/2013, 11:15 AM Pager: [phone]

## 2013-09-21 NOTE — Progress Notes (Signed)
C.diff colitis being appropriately treated with oral metronidazole.  Marta LamasJames O. Gae BonWyatt, III, MD, FACS [phone]--pager [phone]--office [facility]

## 2013-09-22 ENCOUNTER — Encounter (HOSPITAL_COMMUNITY): Payer: Self-pay | Admitting: *Deleted

## 2013-09-22 MED ORDER — BOOST / RESOURCE BREEZE PO LIQD
1.0000 | Freq: Three times a day (TID) | ORAL | Status: DC
  Administered 2013-09-22 – 2013-09-23 (×4): 1 via ORAL

## 2013-09-22 MED ORDER — FUROSEMIDE 40 MG PO TABS
40.0000 mg | ORAL_TABLET | Freq: Every morning | ORAL | Status: DC
  Administered 2013-09-22 – 2013-09-23 (×2): 40 mg via ORAL
  Filled 2013-09-22 (×3): qty 1

## 2013-09-22 NOTE — Progress Notes (Signed)
C.diff colitis.  Continue oral antibiotics.  Ariel LamasJames O. Gae BonWyatt, III, MD, FACS [phone]--pager [phone]--office [facility]

## 2013-09-22 NOTE — Progress Notes (Signed)
Patient ID: Ariel PastelWilma W [name], female   DOB: 09/03/1917, 80 y.o.   MRN: [id number]    Subjective: Pt has a hard time taking pills sometimes due to gag reflex.  She spit up a lot of her pills this morning due to this.  That makes her not feel very good.  Otherwise only 3 BMs yesterday, but all liquid.  Objective: Vital signs in last 24 hours: Temp:  [97.9 F (36.6 C)-99.1 F (37.3 C)] 97.9 F (36.6 C) (04/08 0523) Pulse Rate:  [96-99] 96 (04/08 0523) Resp:  [17] 17 (04/08 0523) BP: (121-161)/(65-85) 161/85 mmHg (04/08 0523) SpO2:  [95 %-96 %] 95 % (04/08 0523) Last BM Date: 09/21/13  Intake/Output from previous day: 04/07 0701 - 04/08 0700 In: 2938.3 [P.O.:540; I.V.:2398.3] Out: 900 [Urine:900] Intake/Output this shift:    PE: Abd: soft, NT, ND, +BS Heart: regular Lungs: CTAB  Lab Results:   Recent Labs  09/20/13 0614  WBC 7.6  HGB 9.6*  HCT 30.2*  PLT 140*   BMET  Recent Labs  09/20/13 0614  NA 147  K 3.9  CL 108  CO2 26  GLUCOSE 88  BUN 22  CREATININE 1.58*  CALCIUM 8.2*   PT/INR No results found for this basename: LABPROT, INR,  in the last 72 hours CMP     Component Value Date/Time   NA 147 09/20/2013 0614   K 3.9 09/20/2013 0614   CL 108 09/20/2013 0614   CO2 26 09/20/2013 0614   GLUCOSE 88 09/20/2013 0614   BUN 22 09/20/2013 0614   CREATININE 1.58* 09/20/2013 0614   CALCIUM 8.2* 09/20/2013 0614   PROT 5.9* 08/16/2013 1829   ALBUMIN 2.6* 08/16/2013 1829   AST 17 08/16/2013 1829   ALT 31 08/16/2013 1829   ALKPHOS 98 08/16/2013 1829   BILITOT 0.7 08/16/2013 1829   GFRNONAA 27* 09/20/2013 0614   GFRAA 31* 09/20/2013 0614   Lipase     Component Value Date/Time   LIPASE 23 04/17/2010 0535       Studies/Results: No results found.  Anti-infectives: Anti-infectives   Start     Dose/Rate Route Frequency Ordered Stop   09/20/13 1400  metroNIDAZOLE (FLAGYL) tablet 500 mg     500 mg Oral 3 times per day 09/20/13 1141     09/19/13 0600  piperacillin-tazobactam (ZOSYN)  IVPB 2.25 g  Status:  Discontinued     2.25 g 100 mL/hr over 30 Minutes Intravenous 3 times per day 09/18/13 2055 09/20/13 1259   09/18/13 2200  piperacillin-tazobactam (ZOSYN) IVPB 3.375 g  Status:  Discontinued     3.375 g 12.5 mL/hr over 240 Minutes Intravenous 3 times per day 09/18/13 2049 09/18/13 2054   09/18/13 2200  piperacillin-tazobactam (ZOSYN) IVPB 3.375 g     3.375 g 100 mL/hr over 30 Minutes Intravenous  Once 09/18/13 2055 09/19/13 0019       Assessment/Plan  1. Recent diverticulitis  2. C diff colitis  Plan: 1. Home lasix restarted. 2. Patient feels a little weaker today with gag incident. 3. Will add resource breeze today for additional calories. 4. cont flagyl D3/14   LOS: 4 days    Letha CapeKelly E Mackenzy Eisenberg 09/22/2013, 9:54 AM Pager: [phone]

## 2013-09-23 MED ORDER — METRONIDAZOLE 500 MG PO TABS: 500.0000 mg | ORAL_TABLET | Freq: Three times a day (TID) | ORAL | Status: AC

## 2013-09-23 MED ORDER — METRONIDAZOLE 500 MG PO TABS: 500.0000 mg | ORAL_TABLET | Freq: Three times a day (TID) | ORAL | Status: DC

## 2013-09-23 NOTE — Discharge Summary (Signed)
Patient ID: Ariel PastelWilma W [name] MRN: [id number] DOB/AGE: Oct 19, 1917 80 y.o.  Admit date: 09/18/2013 Discharge date: 09/23/2013  Procedures: none  Consults: None  Reason for Admission: [age] yof who was recently admitted with diverticular abscess. She was sent home and this appeared to resolve on ct scan. She completed her abx about 9 days ago. She had a drain study that showed no persistent connection and the drain was removed this week. Her temperature slowly increased and this am she had chills and temp of 101 at home. She has been eating some and has been having bms/passing flatus. Doesn't really complain of ab pain. She was seen in [place] [facility] and transferred for likely recurrence.  Admission Diagnoses:  1. Likely recurrent diverticulitis  Hospital Course: the patient was admitted and started back on treatment for her diverticulitis.  She began having diarrhea and a c. Diff colitis was checked. This was positive.  She was just placed on oral flagyl.  Her BMs began to firm up over the next couple of days.  Her diet was advanced.  By HD 5, she was stable for dc home.  Discharge Diagnoses:  Active Problems:   Diverticulitis C diff colitis  Discharge Medications:   Medication List         acetaminophen 500 MG tablet  Commonly known as:  TYLENOL  Take 500 mg by mouth every 6 (six) hours as needed for mild pain.     aspirin 81 MG tablet  Take 81 mg by mouth daily.     famotidine 20 MG tablet  Commonly known as:  PEPCID  Take 20 mg by mouth every morning.     fentaNYL 25 MCG/HR patch  Commonly known as:  DURAGESIC - dosed mcg/hr  Place 25 mcg onto the skin every 3 (three) days.     furosemide 40 MG tablet  Commonly known as:  LASIX  Take 40 mg by mouth every morning.     metoprolol succinate 50 MG 24 hr tablet  Commonly known as:  TOPROL-XL  Take 50 mg by mouth every morning. Take with or immediately following a meal.     metroNIDAZOLE 500 MG tablet  Commonly known as:  FLAGYL   Take 1 tablet (500 mg total) by mouth every 8 (eight) hours.     oxybutynin 3.9 MG/24HR  Commonly known as:  OXYTROL  Place 1 patch onto the skin every 3 (three) days.     traMADol 50 MG tablet  Commonly known as:  ULTRAM  Take 1 tablet (50 mg total) by mouth every 8 (eight) hours as needed for moderate pain.        Discharge Instructions:     Follow-up Information   Follow up with HOXWORTH,BENJAMIN T, MD. Schedule an appointment as soon as possible for a visit in 3 weeks.   Specialty:  General Surgery   Contact information:   [address] [place] [address] [phone]     complete a 14 day course of flagyl  Signed: Letha CapeKelly E [place] Warriner 09/23/2013, 11:03 AM

## 2013-09-23 NOTE — Discharge Instructions (Signed)
Clostridium Difficile Infection Clostridium difficile (C. difficile) is a bacteria found in the intestinal tract or colon. Under certain conditions, it causes diarrhea and sometimes severe disease. The severe form of the disease is known as pseudomembranous colitis (often called C. difficile colitis). This disease can damage the lining of the colon or cause the colon to become enlarged (toxic megacolon).  CAUSES  Your colon normally contains many different bacteria, including C. difficile. The balance of bacteria in your colon can change during illness. This is especially true when you take antibiotic medicine. Taking antibiotics may allow the C. difficile to grow, multiply excessively, and make a toxin that then causes illness. The elderly and people with certain medical conditions have a greater risk of getting C. difficile infections. SYMPTOMS   Watery diarrhea.  Fever.  Fatigue.  Loss of appetite.  Nausea.  Abdominal swelling, pain, or tenderness.  Dehydration. DIAGNOSIS  Your symptoms may make your caregiver suspicious of a C. difficile infection, especially if you have used antibiotics in the preceding weeks. However, there are only 2 ways to know for certain whether you have a C. difficile infection:  A lab test that finds the toxin in your stool.  The specific appearance of an abnormality (pseudomembrane) in your colon. This can only be seen by doing a sigmoidoscopy or colonoscopy. These procedures involve passing an instrument through your rectum to look at the inside of your colon. Your caregiver will help determine if these tests are necessary. TREATMENT   Most people are successfully treated with one of two specific antibiotics, usually given by mouth. Other antibiotics you are receiving are stopped if possible.  Intravenous (IV) fluids and correction of electrolyte imbalance may be necessary.  Rarely, surgery may be needed to remove the infected part of the  intestines.  Careful hand washing by you and your caregivers is important to prevent the spread of infection. In the hospital, your caregivers may also put on gowns and gloves to prevent the spread of the C. difficile bacteria. Your room is also cleaned regularly with a solution containing bleach or a product that is known to kill C. difficile. HOME CARE INSTRUCTIONS  Drink enough fluids to keep your urine clear or pale yellow. Avoid milk, caffeine, and alcohol.  Ask your caregiver for specific rehydration instructions.  Try eating small, frequent meals rather than large meals.  Take your antibiotics as directed. Finish them even if you start to feel better.  Do not use medicines to slow diarrhea. This could delay healing or cause complications.  Wash your hands thoroughly after using the bathroom and before preparing food.  Make sure people who live with you wash their hands often, too.  Carefully disinfect all surfaces with a product that contains chlorine bleach. SEEK MEDICAL CARE IF:  Diarrhea persists longer than expected or recurs after completing your course of antibiotic treatment for the C. difficile infection.  You have trouble staying hydrated. SEEK IMMEDIATE MEDICAL CARE IF:  You develop a new fever.  You have increasing abdominal pain or tenderness.  There is blood in your stools, or your stools are dark black and tarry.  You cannot hold down food or liquids. MAKE SURE YOU:   Understand these instructions.  Will watch your condition.  Will get help right away if you are not doing well or get worse. Document Released: 03/13/2005 Document Revised: 09/28/2012 Document Reviewed: 11/09/2010 [employer] Patient Information 2014 [employer].  

## 2013-10-18 ENCOUNTER — Telehealth (INDEPENDENT_AMBULATORY_CARE_PROVIDER_SITE_OTHER): Payer: Self-pay

## 2013-10-18 NOTE — Telephone Encounter (Signed)
Pts daughter called stating pt has had low grade temp 99.2 since D/C. Pt tolerating normal diet. Voiding well. No abd bloating. No abd pain. No vomiting. Pts daughter states pt finished antibiotic about 10 days ago. She is just concerned that pt is continuing low grade temp. She requests appt this week for reck.  I advised her that this concern will be forwarded to Dr Johna SheriffHoxworth and his assistant for review. She can be reached at [phone].

## 2013-10-18 NOTE — Telephone Encounter (Signed)
She had C diff colitis,  Unless abd pain or diarrhea I think routine office visit OK, you can try to work in if you think necessary

## 2013-10-22 NOTE — Telephone Encounter (Signed)
Follow up call to patient:  Spoke to daughter Liborio NixonJanice, she reports that her mother has improved.  She reports that she has not had any fevers, diarrhea or any abdominal discomfort over the last few days.  Advised daughter to call our office if her symptoms return and we will schedule an office visit with Dr. Johna SheriffHoxworth.

## 2014-11-11 IMAGING — RF DG SINUS / FISTULA TRACT / ABSCESSOGRAM
11 series · 11 of 11 positions shown · IV contrast (omnipaque)
Comparison: CT abdomen pelvis from today

CLINICAL DATA: Followup of diverticular abscess with percutaneous
abscess drain catheter remaining

EXAM:
ABSCESS INJECTION
CONTRAST:  15 cc Omnipaque 2 and
FLUOROSCOPY TIME:  1 min 36 seconds

[Series 1: run · 1 of 1 slices shown (1 of 10)]
[im 1/1]
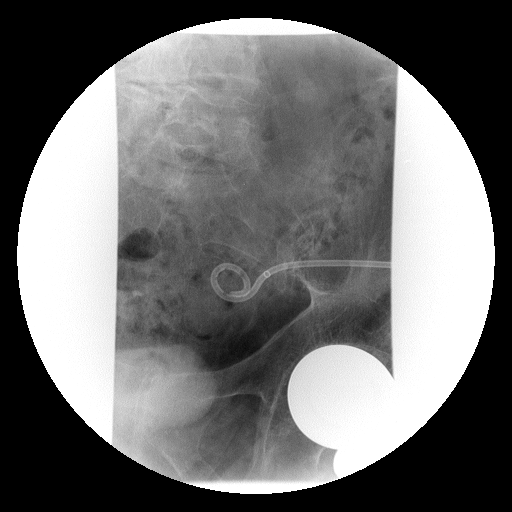

[Series 2: run · 1 of 1 slices shown (2 of 10)]
[im 1/1]
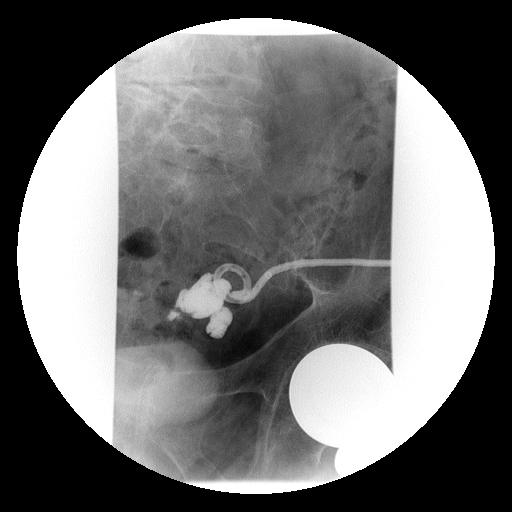

[Series 3: run · 1 of 1 slices shown (3 of 10)]
[im 1/1]
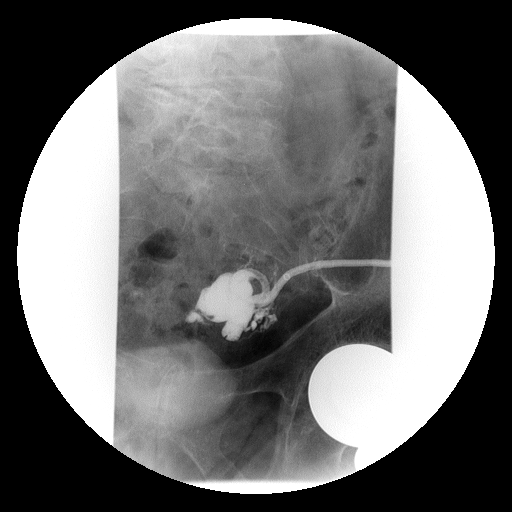

[Series 4: run · 1 of 1 slices shown (4 of 10)]
[im 1/1]
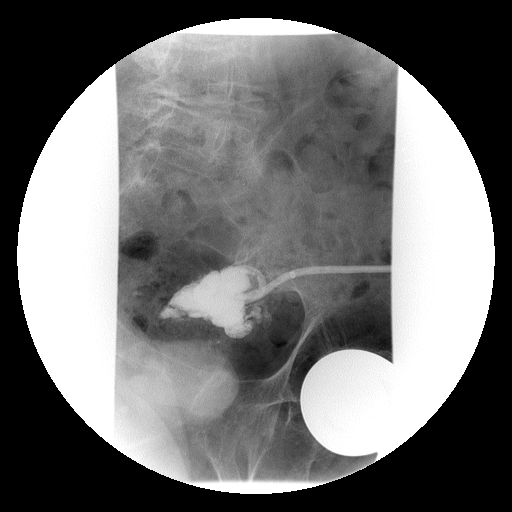

[Series 5: run · 1 of 1 slices shown (5 of 10)]
[im 1/1]
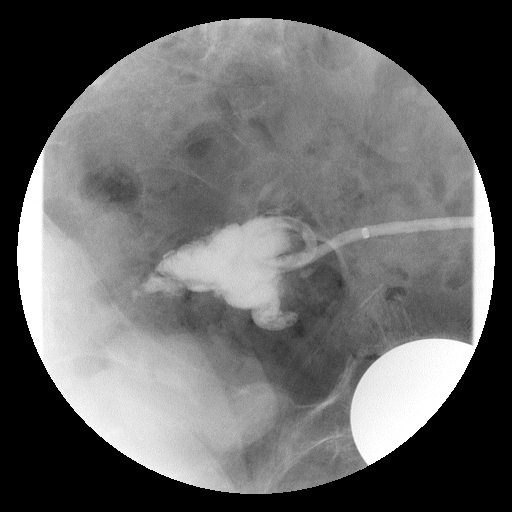

[Series 6: run · 1 of 1 slices shown (6 of 10)]
[im 1/1]
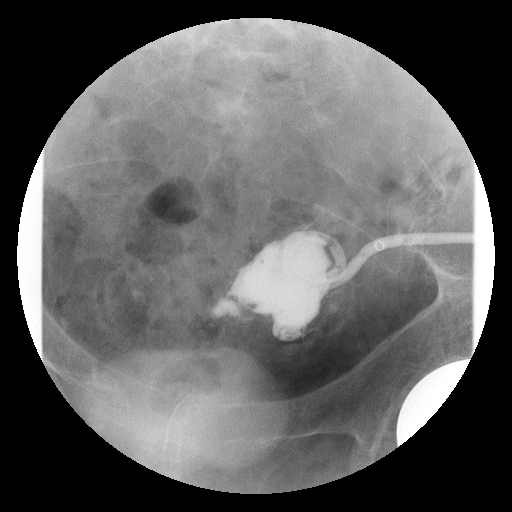

[Series 7: run · 1 of 1 slices shown (7 of 10)]
[im 1/1]
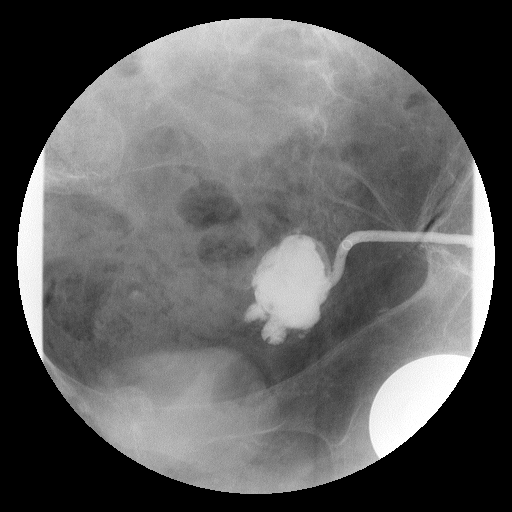

[Series 8: run · 1 of 1 slices shown (8 of 10)]
[im 1/1]
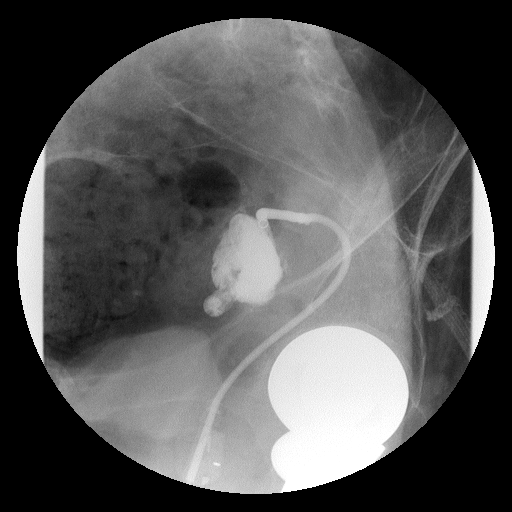

[Series 9: run · 1 of 1 slices shown (9 of 10)]
[im 1/1]
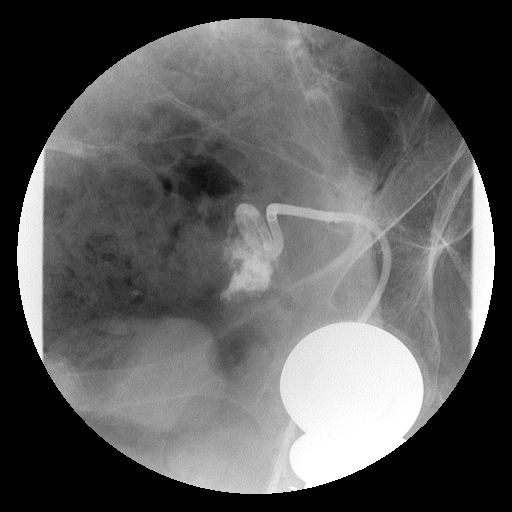

[Series 10: run · 1 of 1 slices shown (10 of 10)]
[im 1/1]
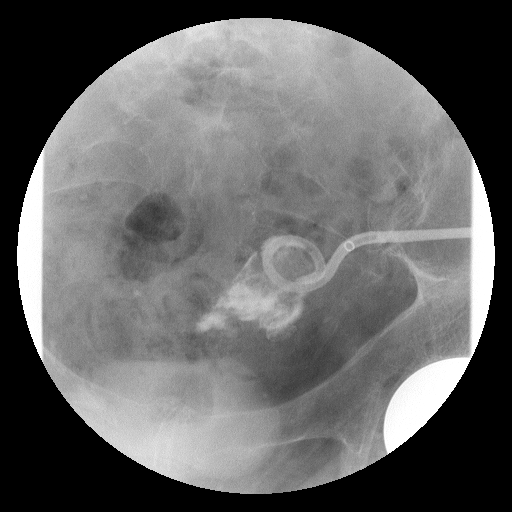

[Series 1001: view not recorded · 0.20mm/px · 1 of 1 slices shown]
[im 1/1]
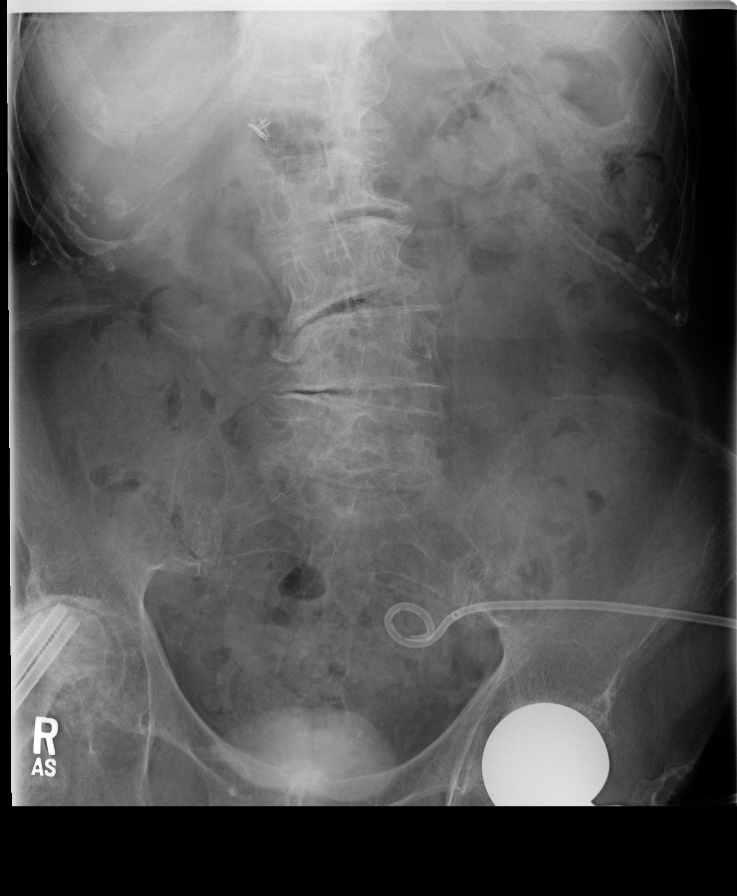

[11 of 11 positions shown; findings below may reference images not displayed]

FINDINGS: Contrast was injected via the indwelling percutaneous abscess
catheter. Contrast fills an irregularly marginated cavity within the
left pelvis. However despite good distention of cavity, no
communication with the adjacent sigmoid colon is seen. The contours
was removed after the study because of patient discomfort and again
noted communication with bowel was demonstrated.
IMPRESSION: Contrast fills an irregular abscess cavity within the left pelvis
but no communication with adjacent bowel is seen.

## 2014-11-11 IMAGING — CT CT ABD-PELV W/ CM
2 series · 15 of 36 positions shown, 19 images · IV contrast ([ID] [id number])
Comparison: 08/23/2013

CLINICAL DATA: Followup diverticular abscess, drain 4 weeks ago.

EXAM:
CT ABDOMEN AND PELVIS WITH CONTRAST
TECHNIQUE: Multidetector CT imaging of the abdomen and pelvis was performed
using the standard protocol following bolus administration of
intravenous contrast.
CONTRAST:  100mL OMNIPAQUE IOHEXOL 300 MG/ML  SOLN

[Series 601: coronal body · coronal · 0.84mm/px · 1 of 120 slices shown, 2 images]
[im 40/120  soft-tissue]
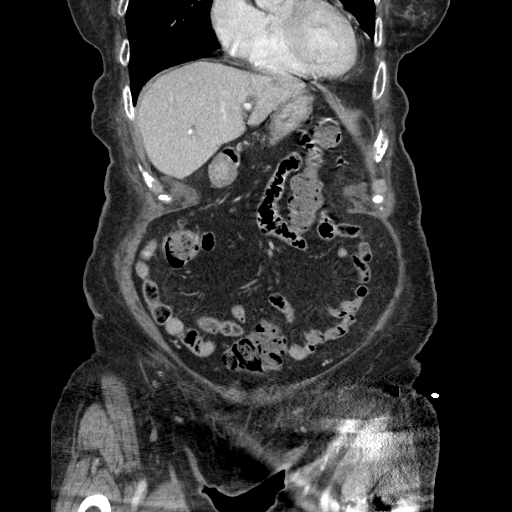
[im 40/120  bone]
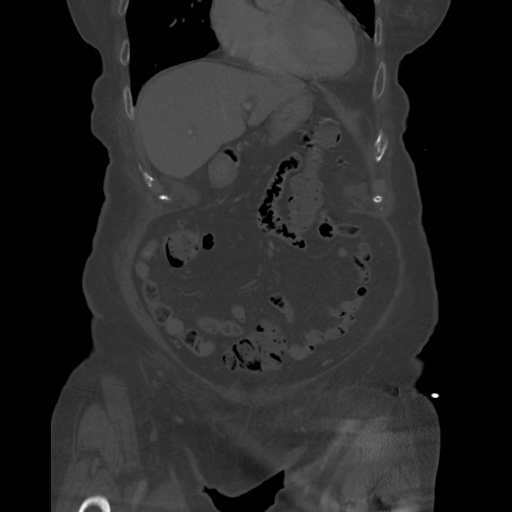

[Series 602: sagittal body · sagittal · 0.84mm/px · 14 of 145 slices shown, 17 images]
[im 5/145  lung]
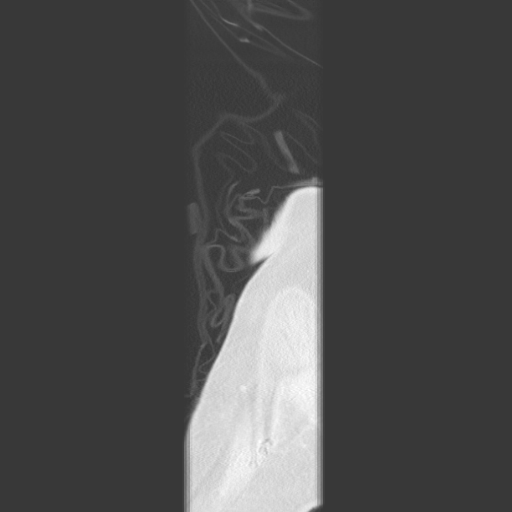
[im 10/145  soft-tissue]
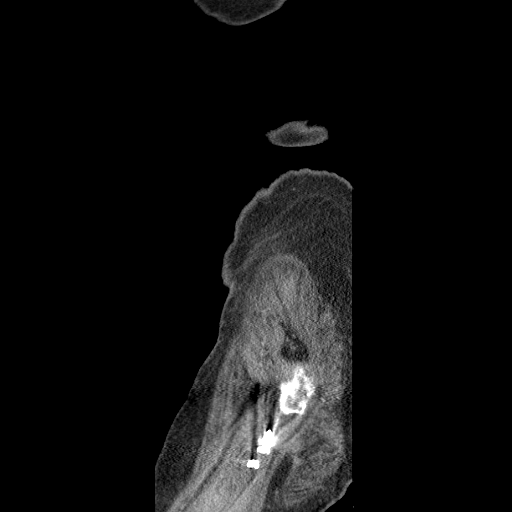
[im 10/145  lung]
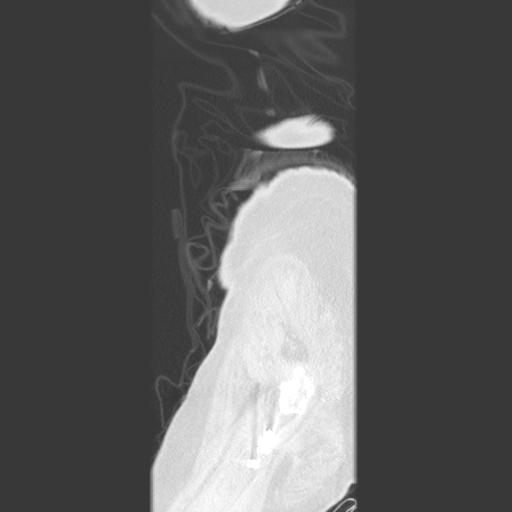
[im 10/145  bone]
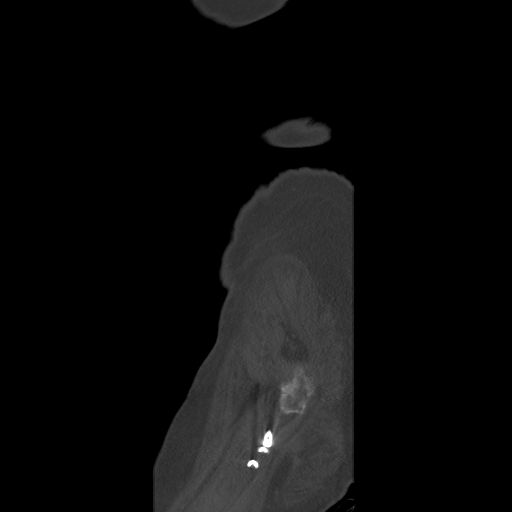
[im 14/145  lung]
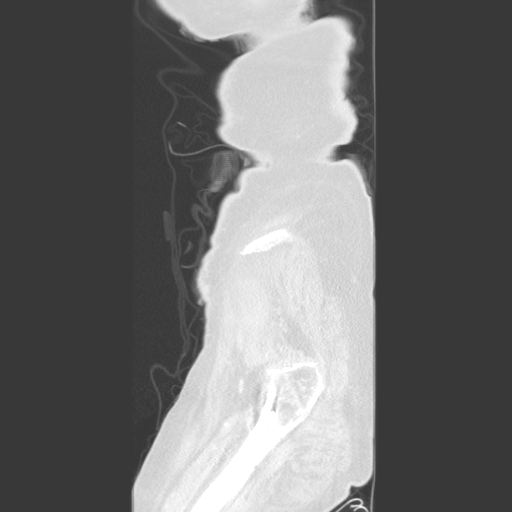
[im 19/145  lung]
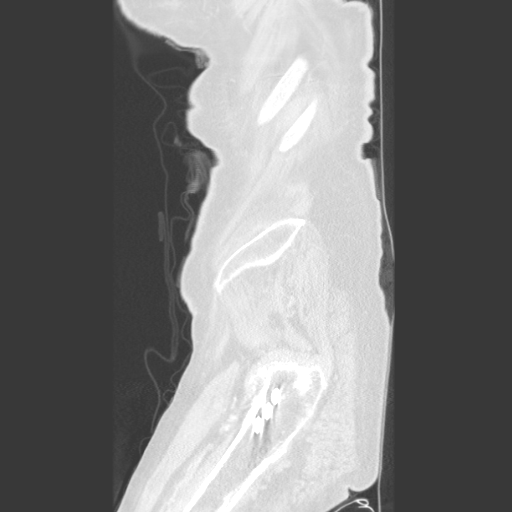
[im 24/145  soft-tissue]
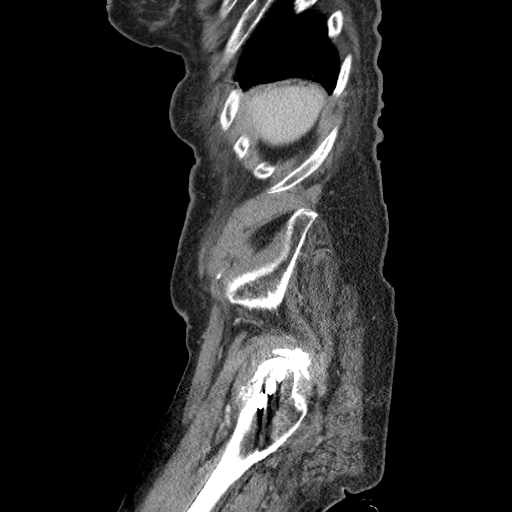
[im 33/145  soft-tissue]
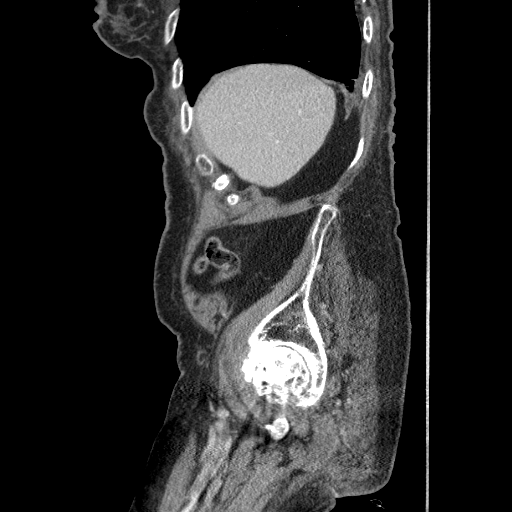
[im 47/145  soft-tissue]
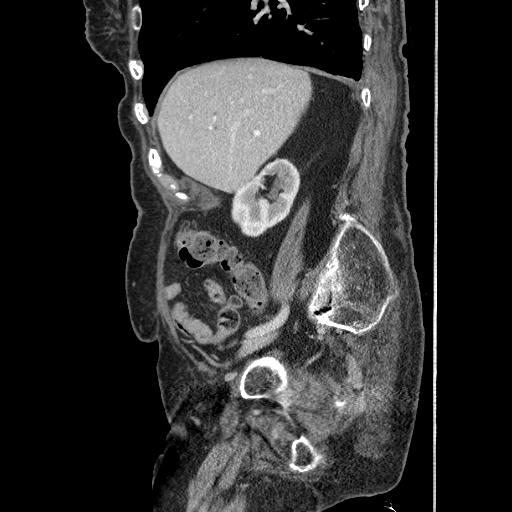
[im 61/145  soft-tissue]
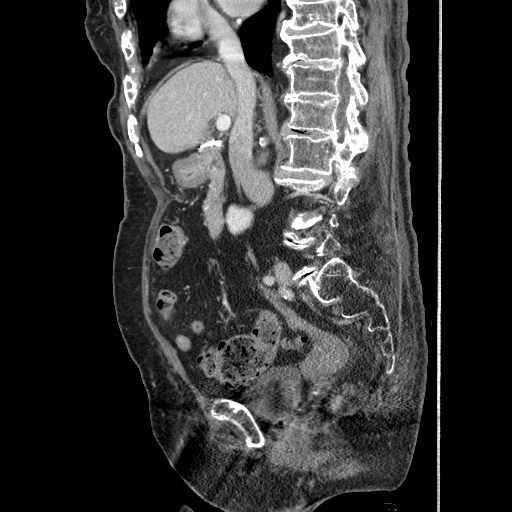
[im 75/145  soft-tissue]
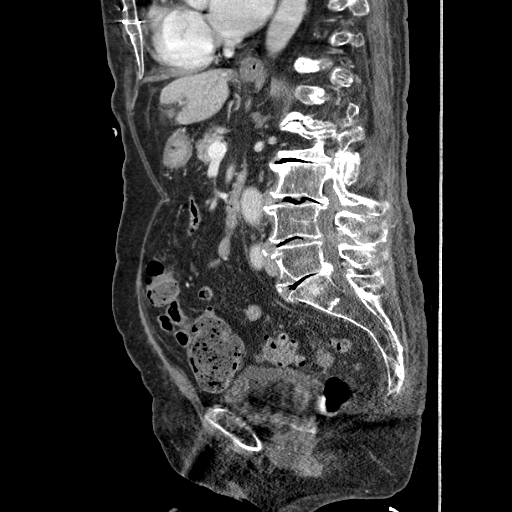
[im 84/145  soft-tissue]
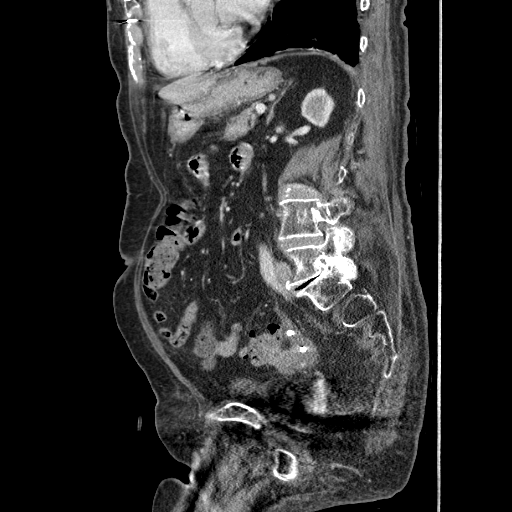
[im 98/145  soft-tissue]
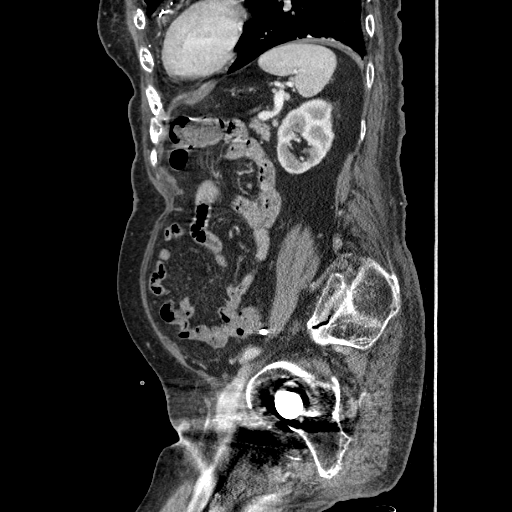
[im 112/145  soft-tissue]
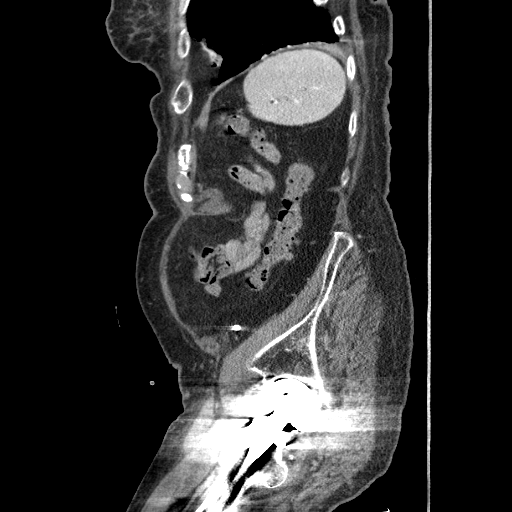
[im 112/145  bone]
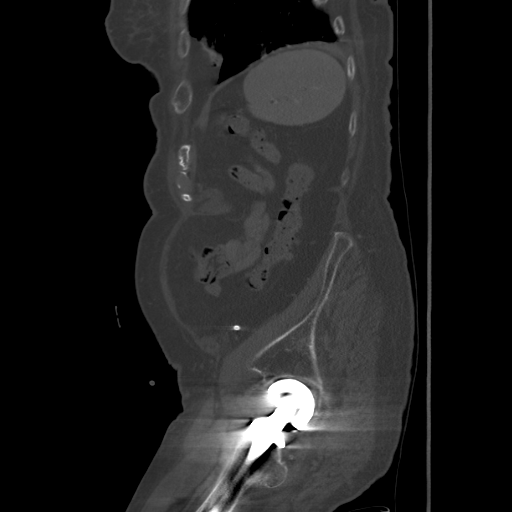
[im 121/145  soft-tissue]
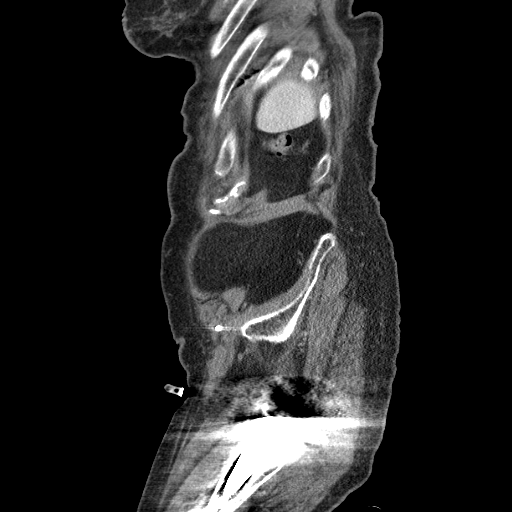
[im 135/145  soft-tissue]
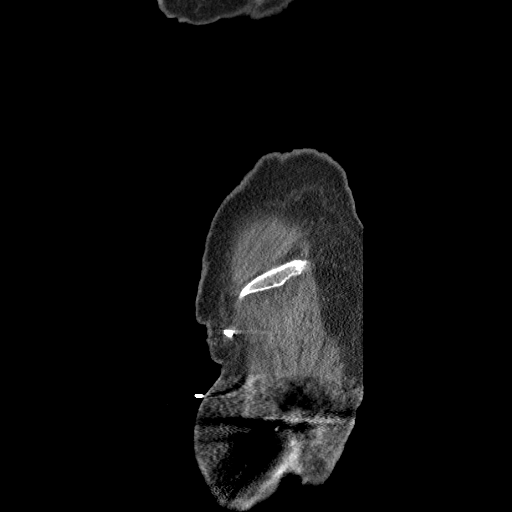

[15 of 36 positions shown; findings below may reference images not displayed]

FINDINGS: Pigtail catheter remains unchanged in positioned along the left
lower pelvic sidewall. The peridiverticula collection remains
decompressed. No new collections. No extraluminal air.

Numerous left colon diverticula are noted mostly along the sigmoid.
There is no current evidence of diverticulitis. Colon is otherwise
unremarkable. Normal small bowel.

Mild subsegmental atelectasis and/ scarring at the lung bases. There
are changes from prior cardiac surgery.

Liver and spleen are unremarkable. Gallbladder surgically absent.
Mild chronic dilation of the common bile duct. Small area cystic
change in the pancreatic head. This is likely due to mildly dilated
side ducts. Findings are essentially stable when allowing for the
lack of contrast on the prior exams. Pancreas otherwise
unremarkable. No adrenal masses.

Bilateral renal cortical thinning. Small stable stones in the right
kidney. Stable right renal cysts. No hydronephrosis. Normal ureters.
Normal bladder.

Uterus is surgically absent. No pathologically enlarged lymph nodes.

Left hip prosthesis in changes from pinning of a right hip fracture
are stable from the most recent prior exam. There are extensive
degenerative changes throughout the visualized spine.
IMPRESSION: 1. Peridiverticular abscess remains decompressed. No significant
residual abscess. No new abscesses. There is no evidence of active
diverticulitis.
2. Multiple chronic findings as detailed, stable from the prior
exam.

## 2016-09-15 DEATH — deceased
